# Patient Record
Sex: Female | Born: 1950 | Race: Black or African American | Hispanic: No | Marital: Single | Smoking: Former smoker
Health system: Southern US, Community
[De-identification: ages and names within clinical notes are randomized; demographics above are authoritative.]

## PROBLEM LIST (undated history)

## (undated) DIAGNOSIS — E785 Hyperlipidemia, unspecified: Secondary | ICD-10-CM

## (undated) DIAGNOSIS — E119 Type 2 diabetes mellitus without complications: Secondary | ICD-10-CM

## (undated) DIAGNOSIS — R569 Unspecified convulsions: Secondary | ICD-10-CM

## (undated) DIAGNOSIS — R0602 Shortness of breath: Secondary | ICD-10-CM

## (undated) DIAGNOSIS — I219 Acute myocardial infarction, unspecified: Secondary | ICD-10-CM

## (undated) DIAGNOSIS — I509 Heart failure, unspecified: Secondary | ICD-10-CM

## (undated) DIAGNOSIS — I10 Essential (primary) hypertension: Secondary | ICD-10-CM

## (undated) DIAGNOSIS — I639 Cerebral infarction, unspecified: Secondary | ICD-10-CM

## (undated) DIAGNOSIS — R062 Wheezing: Secondary | ICD-10-CM

## (undated) DIAGNOSIS — J45909 Unspecified asthma, uncomplicated: Secondary | ICD-10-CM

## (undated) HISTORY — DX: Shortness of breath: R06.02

## (undated) HISTORY — PX: PACEMAKER IMPLANT: EP1218

## (undated) HISTORY — DX: Wheezing: R06.2

---

## 2017-03-29 DIAGNOSIS — I6930 Unspecified sequelae of cerebral infarction: Secondary | ICD-10-CM | POA: Insufficient documentation

## 2017-03-29 DIAGNOSIS — J449 Chronic obstructive pulmonary disease, unspecified: Secondary | ICD-10-CM | POA: Insufficient documentation

## 2017-03-29 DIAGNOSIS — M4802 Spinal stenosis, cervical region: Secondary | ICD-10-CM | POA: Insufficient documentation

## 2017-03-29 DIAGNOSIS — G894 Chronic pain syndrome: Secondary | ICD-10-CM | POA: Insufficient documentation

## 2017-03-29 DIAGNOSIS — M47816 Spondylosis without myelopathy or radiculopathy, lumbar region: Secondary | ICD-10-CM | POA: Insufficient documentation

## 2017-03-29 DIAGNOSIS — K219 Gastro-esophageal reflux disease without esophagitis: Secondary | ICD-10-CM | POA: Insufficient documentation

## 2017-03-29 DIAGNOSIS — E785 Hyperlipidemia, unspecified: Secondary | ICD-10-CM | POA: Insufficient documentation

## 2017-03-29 DIAGNOSIS — Z720 Tobacco use: Secondary | ICD-10-CM | POA: Insufficient documentation

## 2017-03-29 DIAGNOSIS — Z950 Presence of cardiac pacemaker: Secondary | ICD-10-CM | POA: Insufficient documentation

## 2018-05-24 DIAGNOSIS — N3946 Mixed incontinence: Secondary | ICD-10-CM | POA: Insufficient documentation

## 2018-07-07 DIAGNOSIS — Z79899 Other long term (current) drug therapy: Secondary | ICD-10-CM | POA: Insufficient documentation

## 2019-04-18 DIAGNOSIS — K5904 Chronic idiopathic constipation: Secondary | ICD-10-CM | POA: Insufficient documentation

## 2019-04-18 DIAGNOSIS — Z1212 Encounter for screening for malignant neoplasm of rectum: Secondary | ICD-10-CM | POA: Insufficient documentation

## 2019-04-18 DIAGNOSIS — K5900 Constipation, unspecified: Secondary | ICD-10-CM | POA: Insufficient documentation

## 2019-04-18 DIAGNOSIS — Z86010 Personal history of colonic polyps: Secondary | ICD-10-CM | POA: Insufficient documentation

## 2019-04-18 DIAGNOSIS — Z1211 Encounter for screening for malignant neoplasm of colon: Secondary | ICD-10-CM | POA: Insufficient documentation

## 2019-09-16 ENCOUNTER — Other Ambulatory Visit: Payer: Self-pay

## 2019-09-16 ENCOUNTER — Encounter (HOSPITAL_COMMUNITY): Payer: Self-pay | Admitting: Emergency Medicine

## 2019-09-16 ENCOUNTER — Ambulatory Visit (HOSPITAL_COMMUNITY)
Admission: EM | Admit: 2019-09-16 | Discharge: 2019-09-16 | Disposition: A | Discharge status: Home / Self Care | Payer: Medicare Other | Attending: Family Medicine | Admitting: Family Medicine

## 2019-09-16 DIAGNOSIS — E785 Hyperlipidemia, unspecified: Secondary | ICD-10-CM | POA: Diagnosis not present

## 2019-09-16 DIAGNOSIS — J441 Chronic obstructive pulmonary disease with (acute) exacerbation: Secondary | ICD-10-CM | POA: Insufficient documentation

## 2019-09-16 DIAGNOSIS — Z950 Presence of cardiac pacemaker: Secondary | ICD-10-CM | POA: Diagnosis not present

## 2019-09-16 DIAGNOSIS — Z79899 Other long term (current) drug therapy: Secondary | ICD-10-CM | POA: Insufficient documentation

## 2019-09-16 DIAGNOSIS — M545 Low back pain, unspecified: Secondary | ICD-10-CM

## 2019-09-16 DIAGNOSIS — K219 Gastro-esophageal reflux disease without esophagitis: Secondary | ICD-10-CM | POA: Diagnosis not present

## 2019-09-16 DIAGNOSIS — I10 Essential (primary) hypertension: Secondary | ICD-10-CM | POA: Insufficient documentation

## 2019-09-16 DIAGNOSIS — H9202 Otalgia, left ear: Secondary | ICD-10-CM | POA: Diagnosis not present

## 2019-09-16 DIAGNOSIS — Z20828 Contact with and (suspected) exposure to other viral communicable diseases: Secondary | ICD-10-CM | POA: Insufficient documentation

## 2019-09-16 DIAGNOSIS — G8929 Other chronic pain: Secondary | ICD-10-CM | POA: Diagnosis not present

## 2019-09-16 MED ORDER — TIZANIDINE HCL 4 MG PO TABS: 4.0000 mg | ORAL_TABLET | Freq: Four times a day (QID) | ORAL | 0 refills | Status: DC | PRN

## 2019-09-16 MED ORDER — KETOROLAC TROMETHAMINE 30 MG/ML IJ SOLN
INTRAMUSCULAR | Status: AC
  Filled 2019-09-16: qty 1

## 2019-09-16 MED ORDER — PREDNISONE 20 MG PO TABS: 20.0000 mg | ORAL_TABLET | Freq: Two times a day (BID) | ORAL | 0 refills | Status: DC

## 2019-09-16 MED ORDER — HYDROCODONE-ACETAMINOPHEN 7.5-325 MG PO TABS: 1.0000 | ORAL_TABLET | Freq: Four times a day (QID) | ORAL | 0 refills | Status: DC | PRN

## 2019-09-16 MED ORDER — KETOROLAC TROMETHAMINE 30 MG/ML IJ SOLN
30.0000 mg | Freq: Once | INTRAMUSCULAR | Status: AC
  Administered 2019-09-16: 30 mg via INTRAMUSCULAR

## 2019-09-16 MED ORDER — AMOXICILLIN-POT CLAVULANATE 875-125 MG PO TABS: 1.0000 | ORAL_TABLET | Freq: Two times a day (BID) | ORAL | 0 refills | Status: DC

## 2019-09-16 MED ORDER — BENZONATATE 200 MG PO CAPS: 200.0000 mg | ORAL_CAPSULE | Freq: Two times a day (BID) | ORAL | 0 refills | Status: DC | PRN

## 2019-09-16 NOTE — ED Provider Notes (Signed)
[facility]    CSN: [id number] Arrival date & time: 09/16/19  1136      History   Chief Complaint Chief Complaint  Patient presents with  . Otalgia  . Back Pain  . Cough    HPI Kylie Sanders is a 68 y.o. female.   HPI Patient is here with multiple complaints.  She apparently moved to the area recently from the [place] area.  She is living with her daughter.  She states that her son dropped her off.  History is very challenging because when asked frequently answers "I do not know".  She does say she has COPD.  She has had COPD exacerbations.  She currently has a cough.  This is been present for 2 weeks.  She has been using her inhaler more frequently.  He does not know the name of her inhaler, but when I say albuterol she does agree.  She denies fever or body aches.  Denies exposure to coronavirus.  She is wearing an inadequate single-layer mask.  Denies short of breath.  No nausea vomiting. She also complains of left ear pain.  Decreased hearing.  She states that she thinks that her ear is "blocked".  Cannot tell me if this is been a problem for her in the past. She has a history of chronic low back pain.  She states that today it is the worst that it is been in a long time.  She takes Tylenol and ibuprofen "all the time".  No pain into the legs.  No numbness or weakness.  Uncertain diagnosis, disc disease, arthritis, or old injury. I am able to access some of her old records in the care everywhere portion of the chart.  They do document chronic low back pain, trial of physical therapy.  Mostly takes over-the-counter medicines.  I did check the [facility] for narcotics and she has not had any in 2020. History reviewed. No pertinent past medical history.  There are no active problems to display for this patient. 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 No pertinent surgical history.  OB History   No obstetric history on file.      Home Medications    Prior to Admission medications   Medication Sig Start Date End Date Taking? Authorizing Provider  amoxicillin-clavulanate (AUGMENTIN) 875-125 MG tablet Take 1 tablet by mouth every 12 (twelve) hours. 09/16/19   Eustace Moore, MD  benzonatate (TESSALON) 200 MG capsule Take 1 capsule (200 mg total) by mouth 2 (two) times daily as needed for cough. 09/16/19   Eustace Moore, MD  HYDROcodone-acetaminophen (NORCO) 7.5-325 MG tablet Take 1 tablet by mouth every 6 (six) hours as needed for moderate pain. 09/16/19   Eustace Moore, MD  predniSONE (DELTASONE) 20 MG tablet Take 1 tablet (20 mg total) by mouth 2 (two) times daily with a meal. 09/16/19   Raylene Everts, MD  tiZANidine (ZANAFLEX) 4 MG tablet Take 1-2 tablets (4-8 mg total) by mouth every 6 (six) hours as needed for muscle spasms. 09/16/19   Raylene Everts, MD  CARE EVERYWHERE CHART: mouth 2 (two) times a day with meals. 60 tablet  3 11/20/2017  Active   CETIRIZINE 10 mg tablet  Indications: Acute seasonal allergic rhinitis due to pollen TAKE ONE  TABLET BY MOUTH EVERY DAY 90 tablet  1 02/13/2018  Active  albuterol 2.5 mg /3 mL (0.083 %) nebulizer solution  USE 1 VIAL in NEBULIZER EVERY 6 HOURS AS NEEDED FOR WHEEZING 75 mL  3 08/16/2018  Active  umeclidinium-vilanterol (ANORO ELLIPTA) 62.5-25 mcg/actuation dsdv  Inhale 1 puff once daily. 30 each  11 10/04/2018  Active  oxyCODONE-acetaminophen (PERCOCET) 10-325 mg per tablet  Take 1 tablet by mouth every 8 (eight) hours as needed for moderate pain (4-6). 60 tablet  0 11/21/2018  Active  ergocalciferol (VITAMIN D2) 50,000 unit capsule  TAKE ONE CAPSULE BY MOUTH ONCE A WEEK 4 capsule  2 01/15/2019  Active  dextromethorphan-guaifenesin (Mucinex DM) 30-600 mg per 12 hr tablet  Indications: Lower resp. tract infection Take 1 tablet by mouth 2 (two) times a day. 30 tablet  0 01/21/2019  Active  potassium chloride (MICRO-K) 10 mEq CR capsule  TAKE ONE CAPSULE BY MOUTH TWICE DAILY 180 capsule  1 02/13/2019  Active  fluticasone propionate (FLONASE) 50 mcg/spray nasal spray  Indications: Acute seasonal allergic rhinitis due to pollen adminster ONE SPRAY IN EACH NOSTRIL EVERY DAY 16 g  5 02/13/2019  Active  albuterol HFA (ProAir HFA) 90 mcg/actuation inhaler  Inhale 2 puffs every 6 (six) hours as needed for wheezing. 8.5 g  3 03/08/2019  Active  tiZANidine (ZANAFLEX) 4 mg tablet  TAKE ONE TABLET BY MOUTH EVERY 6 HOURS AS NEEDED FOR MUSCLE SPASMS 60 tablet  1 06/27/2019  Active  pravastatin (PRAVACHOL) 40 mg tablet  TAKE 1 TABLET BY MOUTH ONCE DAILY 30 tablet  2 08/08/2019  Active  Amitiza 24 mcg capsule  TAKE 1 CAPSULE BY MOUTH TWICE DAILY WITH MEALS 60 capsule  1 08/08/2019  Active  omeprazole (PriLOSEC) 20 mg DR capsule  TAKE 1 CAPSULE BY MOUTH EVERY DAY 90 capsule  0 08/08/2019  Active  losartan-hydrochlorothiazide (HYZAAR) 50-12.5 mg per tablet  TAKE 1 TABLET BY MOUTH ONCE DAILY 90 tablet  1 08/12/2019  Active    Family History History reviewed. No pertinent  family history.  Social History Social History   Tobacco Use  . Smoking status: Never Smoker  . Smokeless tobacco: Never Used  Substance Use Topics  . Alcohol use: Not Currently  . Drug use: Never     Allergies   Patient has no known allergies.   Review of Systems Review of Systems  Constitutional: Negative for chills and fever.  HENT: Positive for ear pain and hearing loss. Negative for sore throat.   Eyes: Negative for pain and visual disturbance.  Respiratory: Positive for cough and shortness of breath.   Cardiovascular: Negative for chest pain and palpitations.  Gastrointestinal: Positive for constipation. Negative for abdominal pain and vomiting.       CHRONIC  Genitourinary: Negative for dysuria and hematuria.  Musculoskeletal: Positive for back pain. Negative for arthralgias.       CHRONIC  Skin: Negative for color change  and rash.  Neurological: Negative for seizures and syncope.  All other systems reviewed and are negative.    Physical Exam Triage Vital Signs ED Triage Vitals  Enc Vitals Group     BP 09/16/19 1228 121/85     Pulse Rate 09/16/19 1228 64     Resp 09/16/19 1228 17     Temp 09/16/19 1228 (!) 97.3 F (36.3 C)     Temp Source 09/16/19 1228 Tympanic     SpO2 09/16/19 1228 97 %     Weight --      Height --      Head Circumference --      Peak Flow --      Pain Score 09/16/19 1233 8     Pain Loc --      Pain Edu? --      Excl. in [place]? --    No data found.  Updated Vital Signs BP 121/85 (BP Location: Right Arm)   Pulse 64   Temp (!) 97.3 F (36.3 C) (Tympanic)   Resp 17   SpO2 97%       Physical Exam Constitutional:      General: She is not in acute distress.    Appearance: She is well-developed.     Comments: Comes in wheelchair.  States back pain prevents her from walking very far  HENT:     Head: Normocephalic and atraumatic.     Right Ear: Tympanic membrane, ear canal and external ear normal.     Left Ear: Tympanic  membrane, ear canal and external ear normal.     Ears:     Comments: TMs and canals are normal    Mouth/Throat:     Mouth: Mucous membranes are moist.  Eyes:     Conjunctiva/sclera: Conjunctivae normal.     Pupils: Pupils are equal, round, and reactive to light.  Neck:     Musculoskeletal: Normal range of motion.  Cardiovascular:     Rate and Rhythm: Normal rate and regular rhythm.     Heart sounds: Normal heart sounds.     Comments: Pacemaker scar left upper chest Pulmonary:     Effort: Pulmonary effort is normal. No respiratory distress.     Breath sounds: Rhonchi present.     Comments: Few rhonchi.  Harsh cough.  No wheezing Abdominal:     General: There is no distension.     Palpations: Abdomen is soft.  Musculoskeletal: Normal range of motion.  Skin:    General: Skin is warm and dry.  Neurological:     Mental Status: She is alert.  Psychiatric:     Comments: Poor fund of knowledge      UC Treatments / Results  Labs (all labs ordered are listed, but only abnormal results are displayed) Labs Reviewed  NOVEL CORONAVIRUS, NAA (HOSP ORDER, SEND-OUT TO REF LAB; TAT 18-24 HRS)    EKG   Radiology No results found.  Procedures Procedures (including critical care time)  Medications Ordered in UC Medications  ketorolac (TORADOL) 30 MG/ML injection 30 mg (30 mg Intramuscular Given 09/16/19 1321)  ketorolac (TORADOL) 30 MG/ML injection (has no administration in time range)    Initial Impression / Assessment and Plan / UC Course  I have reviewed the triage vital signs and the nursing notes.  Pertinent labs & imaging results that were available during my care of the patient were reviewed by me and considered in my medical decision making (see chart for details).  Time was spent in reviewing her medical records.  Getting history from patient.  Explained the patient's diagnosis and medications.Greater than 50% of this visit was spent in counseling and coordinating  care.  Total face to face time:   40 MIN Final Clinical Impressions(s) / UC Diagnoses   Final diagnoses:  COPD with exacerbation (HCC)  Chronic bilateral low back pain without sciatica     Discharge Instructions     Take the antibiotic 2 x a day for infection Take the prednisone 2 x a day for inflammation Take the tessalon 2 x a day for cough Drink plenty of fluids  Take the norco - hydrocodone as needed severe pain.  Do not drive on the hydrocodone Take the tizanidine as needed muscle relaxer Ice or heat to back  Follow up with a PCP   ED Prescriptions    Medication Sig Dispense Auth. Provider   benzonatate (TESSALON) 200 MG capsule Take 1 capsule (200 mg total) by mouth 2 (two) times daily as needed for cough. 20 capsule Eustace Moore, MD   amoxicillin-clavulanate (AUGMENTIN) 875-125 MG tablet Take 1 tablet by mouth every 12 (twelve) hours. 14 tablet Eustace Moore, MD   HYDROcodone-acetaminophen [facility]) 7.5-325 MG tablet Take 1 tablet by mouth every 6 (six) hours as needed for moderate pain. 15 tablet Eustace Moore, MD   tiZANidine (ZANAFLEX) 4 MG tablet Take 1-2 tablets (4-8 mg total) by mouth every 6 (six) hours as needed for muscle spasms. 21 tablet Eustace Moore, MD   predniSONE (DELTASONE) 20 MG tablet Take 1 tablet (20 mg total) by mouth 2 (two) times daily with a meal. 10 tablet Eustace Moore, MD     I have reviewed the PDMP during this encounter.   Eustace Moore, MD 09/16/19 [phone]

## 2019-09-16 NOTE — Discharge Instructions (Signed)
Take the antibiotic 2 x a day for infection Take the prednisone 2 x a day for inflammation Take the tessalon 2 x a day for cough Drink plenty of fluids  Take the norco - hydrocodone as needed severe pain.  Do not drive on the hydrocodone Take the tizanidine as needed muscle relaxer Ice or heat to back  Follow up with a PCP

## 2019-09-16 NOTE — ED Triage Notes (Signed)
Pt sts left sided ear and facial pain; pt sts lower back pain and some cough; denies fever

## 2019-09-18 LAB — NOVEL CORONAVIRUS, NAA (HOSP ORDER, SEND-OUT TO REF LAB; TAT 18-24 HRS): SARS-CoV-2, NAA: NOT DETECTED

## 2019-12-12 ENCOUNTER — Other Ambulatory Visit: Payer: Self-pay

## 2019-12-12 ENCOUNTER — Ambulatory Visit (INDEPENDENT_AMBULATORY_CARE_PROVIDER_SITE_OTHER): Payer: Medicare Other

## 2019-12-12 ENCOUNTER — Ambulatory Visit (HOSPITAL_COMMUNITY)
Admission: EM | Admit: 2019-12-12 | Discharge: 2019-12-12 | Disposition: A | Discharge status: Home / Self Care | Payer: Medicare Other | Attending: Internal Medicine | Admitting: Internal Medicine

## 2019-12-12 ENCOUNTER — Encounter (HOSPITAL_COMMUNITY): Payer: Self-pay

## 2019-12-12 DIAGNOSIS — U071 COVID-19: Secondary | ICD-10-CM | POA: Insufficient documentation

## 2019-12-12 DIAGNOSIS — Z8616 Personal history of COVID-19: Secondary | ICD-10-CM

## 2019-12-12 DIAGNOSIS — R0602 Shortness of breath: Secondary | ICD-10-CM | POA: Diagnosis not present

## 2019-12-12 DIAGNOSIS — R05 Cough: Secondary | ICD-10-CM | POA: Diagnosis not present

## 2019-12-12 DIAGNOSIS — R0789 Other chest pain: Secondary | ICD-10-CM

## 2019-12-12 HISTORY — DX: Personal history of COVID-19: Z86.16

## 2019-12-12 LAB — BASIC METABOLIC PANEL
Anion gap: 8 (ref 5–15)
BUN: 28 mg/dL — ABNORMAL HIGH (ref 8–23)
CO2: 24 mmol/L (ref 22–32)
Calcium: 11.8 mg/dL — ABNORMAL HIGH (ref 8.9–10.3)
Chloride: 104 mmol/L (ref 98–111)
Creatinine, Ser: 1.54 mg/dL — ABNORMAL HIGH (ref 0.44–1.00)
GFR calc Af Amer: 40 mL/min — ABNORMAL LOW (ref >60)
GFR calc non Af Amer: 35 mL/min — ABNORMAL LOW (ref >60)
Glucose, Bld: 95 mg/dL (ref 70–99)
Potassium: 3.8 mmol/L (ref 3.5–5.1)
Sodium: 136 mmol/L (ref 135–145)

## 2019-12-12 LAB — CBC
HCT: 42.1 % (ref 36.0–46.0)
Hemoglobin: 13.8 g/dL (ref 12.0–15.0)
MCH: 28.1 pg (ref 26.0–34.0)
MCHC: 32.8 g/dL (ref 30.0–36.0)
MCV: 85.7 fL (ref 80.0–100.0)
Platelets: 177 10*3/uL (ref 150–400)
RBC: 4.91 MIL/uL (ref 3.87–5.11)
RDW: 13.2 % (ref 11.5–15.5)
WBC: 3.2 10*3/uL — ABNORMAL LOW (ref 4.0–10.5)
nRBC: 0 % (ref 0.0–0.2)

## 2019-12-12 LAB — POC SARS CORONAVIRUS 2 AG: SARS Coronavirus 2 Ag: POSITIVE — AB

## 2019-12-12 LAB — POC SARS CORONAVIRUS 2 AG -  ED: SARS Coronavirus 2 Ag: POSITIVE — AB

## 2019-12-12 MED ORDER — ONDANSETRON 4 MG PO TBDP: 4.0000 mg | ORAL_TABLET | Freq: Three times a day (TID) | ORAL | 0 refills | Status: DC | PRN

## 2019-12-12 MED ORDER — BENZONATATE 100 MG PO CAPS: 100.0000 mg | ORAL_CAPSULE | Freq: Three times a day (TID) | ORAL | 0 refills | Status: DC

## 2019-12-12 MED ORDER — IBUPROFEN 600 MG PO TABS: 600.0000 mg | ORAL_TABLET | Freq: Four times a day (QID) | ORAL | 0 refills | Status: DC | PRN

## 2019-12-12 NOTE — ED Provider Notes (Signed)
[facility]    CSN: [id number] Arrival date & time: 12/12/19  1113      History   Chief Complaint Chief Complaint  Patient presents with  . Dizziness  . Headache  . Back Pain    HPI Kylie Sanders is a 68 y.o. female with a history of COPD comes to the urgent care with a 1 week history of headache, dizziness and back pain. Patient says symptoms started a week ago and has been persistent. Kylie Sanders has a cough which is not productive of sputum. Kylie Sanders has had chills. No febrile episodes. No loss of taste or smell. Patient's daughter was recently diagnosed with COVID-19 infection. Patient has had decreased oral intake, has some nausea but denies diarrhea. Patient has some lightheadedness and unsteadiness on her feet. No numbness or tingling.   History reviewed. No pertinent past medical history.  There are no problems to display for this patient.   History reviewed. No pertinent surgical history.  OB History   No obstetric history on file.      Home Medications    Prior to Admission medications   Medication Sig Start Date End Date Taking? Authorizing Provider  benzonatate (TESSALON) 100 MG capsule Take 1 capsule (100 mg total) by mouth every 8 (eight) hours. 12/12/19   LampteyBritta Mccreedy, Lu Paradise O, MD  HYDROcodone-acetaminophen (NORCO) 7.5-325 MG tablet Take 1 tablet by mouth every 6 (six) hours as needed for moderate pain. 09/16/19   Eustace MooreNelson, Yvonne Sue, MD  ibuprofen (ADVIL) 600 MG tablet Take 1 tablet (600 mg total) by mouth every 6 (six) hours as needed. 12/12/19   Merrilee JanskyLamptey, Sondos Wolfman O, MD  ondansetron (ZOFRAN ODT) 4 MG disintegrating tablet Take 1 tablet (4 mg total) by mouth every 8 (eight) hours as needed for nausea or vomiting. 12/12/19   Nobie Alleyne, Britta MccreedyPhilip O, MD  tiZANidine (ZANAFLEX) 4 MG tablet Take 1-2 tablets (4-8 mg total) by mouth every 6 (six) hours as needed for muscle spasms. 09/16/19   Eustace MooreNelson, Yvonne Sue, MD    Family History History reviewed. No pertinent family  history.  Social History Social History   Tobacco Use  . Smoking status: Never Smoker  . Smokeless tobacco: Never Used  Substance Use Topics  . Alcohol use: Not Currently  . Drug use: Never     Allergies   Patient has no known allergies.   Review of Systems Review of Systems  Constitutional: Positive for activity change, chills and fatigue. Negative for fever.  HENT: Positive for congestion. Negative for postnasal drip and rhinorrhea.   Eyes: Positive for pain. Negative for photophobia and redness.  Respiratory: Positive for cough, chest tightness and shortness of breath.   Gastrointestinal: Positive for nausea and vomiting. Negative for diarrhea.  Endocrine: Negative.   Genitourinary: Negative for dysuria, frequency and urgency.  Musculoskeletal: Positive for arthralgias and myalgias. Negative for joint swelling.  Skin: Negative for rash and wound.  Neurological: Positive for dizziness, weakness, light-headedness and headaches.  Psychiatric/Behavioral: Negative for confusion and decreased concentration.     Physical Exam Triage Vital Signs ED Triage Vitals  Enc Vitals Group     BP      Pulse      Resp      Temp      Temp src      SpO2      Weight      Height      Head Circumference      Peak Flow      Pain Score  Pain Loc      Pain Edu?      Excl. in [place]?    No data found.  Updated Vital Signs BP 108/72 (BP Location: Right Arm)   Pulse 67   Temp 99.1 F (37.3 C) (Oral)   Resp 20   SpO2 98%   Visual Acuity Right Eye Distance:   Left Eye Distance:   Bilateral Distance:    Right Eye Near:   Left Eye Near:    Bilateral Near:     Physical Exam Constitutional:      General: Kylie Sanders is not in acute distress.    Appearance: Kylie Sanders is ill-appearing.  HENT:     Mouth/Throat:     Mouth: Mucous membranes are moist.  Eyes:     Extraocular Movements:     Right eye: Normal extraocular motion and no nystagmus.     Left eye: Normal extraocular motion and  no nystagmus.  Cardiovascular:     Rate and Rhythm: Normal rate and regular rhythm.     Heart sounds: Normal heart sounds. No murmur. No friction rub.  Pulmonary:     Effort: Pulmonary effort is normal. No respiratory distress.     Breath sounds: Normal breath sounds. No stridor. No wheezing or rhonchi.  Abdominal:     General: Bowel sounds are normal. There is no distension.     Palpations: Abdomen is soft. There is no mass.     Tenderness: There is no abdominal tenderness.  Musculoskeletal:        General: No swelling or tenderness. Normal range of motion.     Cervical back: Normal range of motion and neck supple. No rigidity.  Lymphadenopathy:     Cervical: No cervical adenopathy.  Skin:    General: Skin is warm.     Capillary Refill: Capillary refill takes less than 2 seconds.     Findings: No erythema or rash.  Neurological:     Mental Status: Kylie Sanders is alert.     GCS: GCS eye subscore is 4. GCS verbal subscore is 5. GCS motor subscore is 6.     Sensory: No sensory deficit.     Motor: No weakness.  Psychiatric:        Mood and Affect: Mood is depressed.     Comments: Patient has flat affect      UC Treatments / Results  Labs (all labs ordered are listed, but only abnormal results are displayed) Labs Reviewed  CBC - Abnormal; Notable for the following components:      Result Value   WBC 3.2 (*)    All other components within normal limits  BASIC METABOLIC PANEL - Abnormal; Notable for the following components:   BUN 28 (*)    Creatinine, Ser 1.54 (*)    Calcium 11.8 (*)    GFR calc non Af Amer 35 (*)    GFR calc Af Amer 40 (*)    All other components within normal limits  POC SARS CORONAVIRUS 2 AG -  ED - Abnormal; Notable for the following components:   SARS Coronavirus 2 Ag POSITIVE (*)    All other components within normal limits  POC SARS CORONAVIRUS 2 AG - Abnormal; Notable for the following components:   SARS Coronavirus 2 Ag POSITIVE (*)    All other  components within normal limits    EKG   Radiology DG Chest 2 View  Result Date: 12/12/2019 CLINICAL DATA:  Pt states Kylie Sanders has been experiencing neck pain, leg  pain, and back pain with difficulty breathing x Monday. Pt is not a smoker. Pt has hx of hypertension and diabetes. Denies previous chest/lung injury. Pt does have a pacemaker. SRP EXAM: CHEST - 2 VIEW COMPARISON:  None. FINDINGS: Cardiac silhouette is normal in size and configuration. Left anterior chest wall sequential pacemaker is well positioned, leads projecting in the right atrium and right ventricle. No mediastinal or hilar masses or evidence of adenopathy. The pulmonary arteries are prominent. Lungs are mildly hyperexpanded, but clear. No pleural effusion or pneumothorax. Skeletal structures are intact. IMPRESSION: No active cardiopulmonary disease. Electronically Signed   By: Amie Portland M.D.   On: 12/12/2019 13:16    Procedures Procedures (including critical care time)  Medications Ordered in UC Medications - No data to display  Initial Impression / Assessment and Plan / UC Course  I have reviewed the triage vital signs and the nursing notes.  Pertinent labs & imaging results that were available during my care of the patient were reviewed by me and considered in my medical decision making (see chart for details).     1. COVID-19 infection: Chest x-ray is negative for acute lung infiltrate Point-of-care COVID-19 test is positive Tessalon Perles as needed for cough Zofran ODT as needed for nausea/vomiting Ibuprofen 600 mg every 6 hours as needed for body aches Patient is advised to self isolate If patient symptoms worsen Kylie Sanders is advised to go to the emergency department to be evaluated. Final Clinical Impressions(s) / UC Diagnoses   Final diagnoses:  COVID-19 virus infection     Discharge Instructions     Increase oral fluid intake Patient is advised to self isolate Please take medications as prescribed by  me Shortness of breath worsening low to emergency department to be reevaluated You will receive a call from the outpatient infusion center to keep your phone close by where you can hear it should it ring.    ED Prescriptions    Medication Sig Dispense Auth. Provider   benzonatate (TESSALON) 100 MG capsule Take 1 capsule (100 mg total) by mouth every 8 (eight) hours. 30 capsule Kalel Harty, Britta Mccreedy, MD   ondansetron (ZOFRAN ODT) 4 MG disintegrating tablet Take 1 tablet (4 mg total) by mouth every 8 (eight) hours as needed for nausea or vomiting. 20 tablet Jerimie Mancuso, Britta Mccreedy, MD   ibuprofen (ADVIL) 600 MG tablet Take 1 tablet (600 mg total) by mouth every 6 (six) hours as needed. 30 tablet Jessicia Napolitano, Britta Mccreedy, MD     PDMP not reviewed this encounter.   Merrilee Jansky, MD 12/12/19 [phone]

## 2019-12-12 NOTE — ED Triage Notes (Signed)
Pt presents with headache, dizziness, and back pain since Friday of last week.

## 2019-12-12 NOTE — Discharge Instructions (Addendum)
Increase oral fluid intake Patient is advised to self isolate Please take medications as prescribed by me Shortness of breath worsening low to emergency department to be reevaluated You will receive a call from the outpatient infusion center to keep your phone close by where you can hear it should it ring.

## 2019-12-13 ENCOUNTER — Telehealth: Payer: Self-pay | Admitting: Unknown Physician Specialty

## 2019-12-13 DIAGNOSIS — E876 Hypokalemia: Secondary | ICD-10-CM

## 2019-12-13 HISTORY — DX: Hypokalemia: E87.6

## 2019-12-13 NOTE — Telephone Encounter (Signed)
Called to discuss with patient about Covid symptoms and the use of bamlanivimab, a monoclonal antibody infusion for those with mild to moderate Covid symptoms and at a high risk of hospitalization.   Message left to call back  

## 2019-12-13 NOTE — Telephone Encounter (Signed)
  I connected by phone with Kylie Sanders on 12/13/2019 at 11:33 AM to discuss the potential use of an new treatment for mild to moderate COVID-19 viral infection in non-hospitalized patients.  This patient is a 69 y.o. female that meets the FDA criteria for Emergency Use Authorization of bamlanivimab or casirivimab\imdevimab.  Has a (+) direct SARS-CoV-2 viral test result  Has mild or moderate COVID-19   Is ? 67 years of age and weighs ? 40 kg  Is NOT hospitalized due to COVID-19  Is NOT requiring oxygen therapy or requiring an increase in baseline oxygen flow rate due to COVID-19  Is within 10 days of symptom onset  Has at least one of the high risk factor(s) for progression to severe COVID-19 and/or hospitalization as defined in EUA.  Specific high risk criteria : >/= 67 yo   I have spoken and communicated the following to the patient or parent/caregiver:  1. FDA has authorized the emergency use of bamlanivimab and casirivimab\imdevimab for the treatment of mild to moderate COVID-19 in adults and pediatric patients with positive results of direct SARS-CoV-2 viral testing who are 51 years of age and older weighing at least 40 kg, and who are at high risk for progressing to severe COVID-19 and/or hospitalization.  2. The significant known and potential risks and benefits of bamlanivimab and casirivimab\imdevimab, and the extent to which such potential risks and benefits are unknown.  3. Information on available alternative treatments and the risks and benefits of those alternatives, including clinical trials.  4. Patients treated with bamlanivimab and casirivimab\imdevimab should continue to self-isolate and use infection control measures (e.g., wear mask, isolate, social distance, avoid sharing personal items, clean and disinfect "high touch" surfaces, and frequent handwashing) according to CDC guidelines.   5. The patient or parent/caregiver has the option to accept or refuse  bamlanivimab or casirivimab\imdevimab .  After reviewing this information with the patient, The patient has DECLINED offer to receive the infusion. Kylie Sanders 12/13/2019 11:33 AM  Symptom onset 12/26 - Pt would like a guarantee that mab treatment will help her pain.  I could not give that so declined treatment

## 2019-12-17 ENCOUNTER — Encounter (HOSPITAL_COMMUNITY): Payer: Self-pay | Admitting: Student

## 2019-12-17 ENCOUNTER — Inpatient Hospital Stay (HOSPITAL_COMMUNITY)
Admission: EM | Admit: 2019-12-17 | Discharge: 2019-12-22 | DRG: 177 | Disposition: A | Discharge status: Home Health | Payer: Medicare Other | Attending: Internal Medicine | Admitting: Internal Medicine

## 2019-12-17 ENCOUNTER — Other Ambulatory Visit: Payer: Self-pay

## 2019-12-17 ENCOUNTER — Emergency Department (HOSPITAL_COMMUNITY): Payer: Medicare Other

## 2019-12-17 DIAGNOSIS — Z87891 Personal history of nicotine dependence: Secondary | ICD-10-CM

## 2019-12-17 DIAGNOSIS — N179 Acute kidney failure, unspecified: Secondary | ICD-10-CM | POA: Diagnosis present

## 2019-12-17 DIAGNOSIS — Z79899 Other long term (current) drug therapy: Secondary | ICD-10-CM

## 2019-12-17 DIAGNOSIS — G4733 Obstructive sleep apnea (adult) (pediatric): Secondary | ICD-10-CM | POA: Diagnosis not present

## 2019-12-17 DIAGNOSIS — I252 Old myocardial infarction: Secondary | ICD-10-CM

## 2019-12-17 DIAGNOSIS — Z8673 Personal history of transient ischemic attack (TIA), and cerebral infarction without residual deficits: Secondary | ICD-10-CM | POA: Diagnosis not present

## 2019-12-17 DIAGNOSIS — J440 Chronic obstructive pulmonary disease with acute lower respiratory infection: Secondary | ICD-10-CM | POA: Diagnosis not present

## 2019-12-17 DIAGNOSIS — Z833 Family history of diabetes mellitus: Secondary | ICD-10-CM

## 2019-12-17 DIAGNOSIS — G8929 Other chronic pain: Secondary | ICD-10-CM | POA: Diagnosis present

## 2019-12-17 DIAGNOSIS — Z993 Dependence on wheelchair: Secondary | ICD-10-CM

## 2019-12-17 DIAGNOSIS — I130 Hypertensive heart and chronic kidney disease with heart failure and stage 1 through stage 4 chronic kidney disease, or unspecified chronic kidney disease: Secondary | ICD-10-CM | POA: Diagnosis not present

## 2019-12-17 DIAGNOSIS — A0839 Other viral enteritis: Secondary | ICD-10-CM | POA: Diagnosis present

## 2019-12-17 DIAGNOSIS — Z8249 Family history of ischemic heart disease and other diseases of the circulatory system: Secondary | ICD-10-CM

## 2019-12-17 DIAGNOSIS — I5032 Chronic diastolic (congestive) heart failure: Secondary | ICD-10-CM | POA: Diagnosis not present

## 2019-12-17 DIAGNOSIS — N182 Chronic kidney disease, stage 2 (mild): Secondary | ICD-10-CM | POA: Diagnosis present

## 2019-12-17 DIAGNOSIS — U071 COVID-19: Principal | ICD-10-CM

## 2019-12-17 DIAGNOSIS — E860 Dehydration: Secondary | ICD-10-CM | POA: Diagnosis not present

## 2019-12-17 DIAGNOSIS — E861 Hypovolemia: Secondary | ICD-10-CM | POA: Diagnosis not present

## 2019-12-17 DIAGNOSIS — Z950 Presence of cardiac pacemaker: Secondary | ICD-10-CM | POA: Diagnosis not present

## 2019-12-17 DIAGNOSIS — R0600 Dyspnea, unspecified: Secondary | ICD-10-CM

## 2019-12-17 DIAGNOSIS — J1282 Pneumonia due to coronavirus disease 2019: Secondary | ICD-10-CM | POA: Diagnosis present

## 2019-12-17 DIAGNOSIS — E1122 Type 2 diabetes mellitus with diabetic chronic kidney disease: Secondary | ICD-10-CM | POA: Diagnosis not present

## 2019-12-17 DIAGNOSIS — R531 Weakness: Secondary | ICD-10-CM | POA: Diagnosis not present

## 2019-12-17 DIAGNOSIS — Z79891 Long term (current) use of opiate analgesic: Secondary | ICD-10-CM | POA: Diagnosis not present

## 2019-12-17 HISTORY — DX: Cerebral infarction, unspecified: I63.9

## 2019-12-17 HISTORY — DX: Hyperlipidemia, unspecified: E78.5

## 2019-12-17 HISTORY — DX: Acute myocardial infarction, unspecified: I21.9

## 2019-12-17 HISTORY — DX: Unspecified asthma, uncomplicated: J45.909

## 2019-12-17 HISTORY — DX: Essential (primary) hypertension: I10

## 2019-12-17 HISTORY — DX: Heart failure, unspecified: I50.9

## 2019-12-17 HISTORY — DX: Unspecified convulsions: R56.9

## 2019-12-17 HISTORY — DX: Type 2 diabetes mellitus without complications: E11.9

## 2019-12-17 LAB — CBC WITH DIFFERENTIAL/PLATELET
Abs Immature Granulocytes: 0.02 10*3/uL (ref 0.00–0.07)
Basophils Absolute: 0 10*3/uL (ref 0.0–0.1)
Basophils Relative: 0 %
Eosinophils Absolute: 0 10*3/uL (ref 0.0–0.5)
Eosinophils Relative: 0 %
HCT: 43.6 % (ref 36.0–46.0)
Hemoglobin: 14.7 g/dL (ref 12.0–15.0)
Immature Granulocytes: 0 %
Lymphocytes Relative: 21 %
Lymphs Abs: 1 10*3/uL (ref 0.7–4.0)
MCH: 28.5 pg (ref 26.0–34.0)
MCHC: 33.7 g/dL (ref 30.0–36.0)
MCV: 84.7 fL (ref 80.0–100.0)
Monocytes Absolute: 0.3 10*3/uL (ref 0.1–1.0)
Monocytes Relative: 6 %
Neutro Abs: 3.4 10*3/uL (ref 1.7–7.7)
Neutrophils Relative %: 73 %
Platelets: 179 10*3/uL (ref 150–400)
RBC: 5.15 MIL/uL — ABNORMAL HIGH (ref 3.87–5.11)
RDW: 13.1 % (ref 11.5–15.5)
WBC: 4.7 10*3/uL (ref 4.0–10.5)
nRBC: 0 % (ref 0.0–0.2)

## 2019-12-17 LAB — COMPREHENSIVE METABOLIC PANEL
ALT: 15 U/L (ref 0–44)
AST: 21 U/L (ref 15–41)
Albumin: 3.5 g/dL (ref 3.5–5.0)
Alkaline Phosphatase: 73 U/L (ref 38–126)
Anion gap: 12 (ref 5–15)
BUN: 48 mg/dL — ABNORMAL HIGH (ref 8–23)
CO2: 22 mmol/L (ref 22–32)
Calcium: 11.9 mg/dL — ABNORMAL HIGH (ref 8.9–10.3)
Chloride: 103 mmol/L (ref 98–111)
Creatinine, Ser: 1.9 mg/dL — ABNORMAL HIGH (ref 0.44–1.00)
GFR calc Af Amer: 31 mL/min — ABNORMAL LOW (ref >60)
GFR calc non Af Amer: 27 mL/min — ABNORMAL LOW (ref >60)
Glucose, Bld: 113 mg/dL — ABNORMAL HIGH (ref 70–99)
Potassium: 4 mmol/L (ref 3.5–5.1)
Sodium: 137 mmol/L (ref 135–145)
Total Bilirubin: 0.9 mg/dL (ref 0.3–1.2)
Total Protein: 8.2 g/dL — ABNORMAL HIGH (ref 6.5–8.1)

## 2019-12-17 LAB — CBC
HCT: 42.7 % (ref 36.0–46.0)
Hemoglobin: 13.8 g/dL (ref 12.0–15.0)
MCH: 27.7 pg (ref 26.0–34.0)
MCHC: 32.3 g/dL (ref 30.0–36.0)
MCV: 85.6 fL (ref 80.0–100.0)
Platelets: 170 10*3/uL (ref 150–400)
RBC: 4.99 MIL/uL (ref 3.87–5.11)
RDW: 13.1 % (ref 11.5–15.5)
WBC: 4.2 10*3/uL (ref 4.0–10.5)
nRBC: 0 % (ref 0.0–0.2)

## 2019-12-17 LAB — URINALYSIS, ROUTINE W REFLEX MICROSCOPIC
Bilirubin Urine: NEGATIVE
Glucose, UA: NEGATIVE mg/dL
Hgb urine dipstick: NEGATIVE
Ketones, ur: 5 mg/dL — AB
Leukocytes,Ua: NEGATIVE
Nitrite: NEGATIVE
Protein, ur: 100 mg/dL — AB
Specific Gravity, Urine: 1.016 (ref 1.005–1.030)
pH: 5 (ref 5.0–8.0)

## 2019-12-17 LAB — CREATININE, SERUM
Creatinine, Ser: 1.64 mg/dL — ABNORMAL HIGH (ref 0.44–1.00)
GFR calc Af Amer: 37 mL/min — ABNORMAL LOW (ref >60)
GFR calc non Af Amer: 32 mL/min — ABNORMAL LOW (ref >60)

## 2019-12-17 LAB — ABO/RH: ABO/RH(D): A POS

## 2019-12-17 LAB — HIV ANTIBODY (ROUTINE TESTING W REFLEX): HIV Screen 4th Generation wRfx: NONREACTIVE

## 2019-12-17 LAB — C-REACTIVE PROTEIN: CRP: 9.6 mg/dL — ABNORMAL HIGH (ref <1.0)

## 2019-12-17 LAB — FERRITIN: Ferritin: 557 ng/mL — ABNORMAL HIGH (ref 11–307)

## 2019-12-17 LAB — D-DIMER, QUANTITATIVE: D-Dimer, Quant: 1.1 ug/mL-FEU — ABNORMAL HIGH (ref 0.00–0.50)

## 2019-12-17 MED ORDER — SODIUM CHLORIDE 0.9 % IV SOLN
200.0000 mg | Freq: Once | INTRAVENOUS | Status: AC
  Administered 2019-12-17: 200 mg via INTRAVENOUS
  Filled 2019-12-17: qty 40

## 2019-12-17 MED ORDER — ONDANSETRON HCL 4 MG/2ML IJ SOLN
4.0000 mg | Freq: Four times a day (QID) | INTRAMUSCULAR | Status: DC | PRN
  Administered 2019-12-18 – 2019-12-22 (×3): 4 mg via INTRAVENOUS
  Filled 2019-12-17 (×4): qty 2

## 2019-12-17 MED ORDER — SODIUM CHLORIDE 0.9 % IV BOLUS
500.0000 mL | Freq: Once | INTRAVENOUS | Status: AC
  Administered 2019-12-17: 13:00:00 500 mL via INTRAVENOUS

## 2019-12-17 MED ORDER — HYDROCODONE-ACETAMINOPHEN 7.5-325 MG PO TABS
1.0000 | ORAL_TABLET | Freq: Four times a day (QID) | ORAL | Status: DC | PRN
  Administered 2019-12-17 – 2019-12-22 (×8): 1 via ORAL
  Filled 2019-12-17 (×8): qty 1

## 2019-12-17 MED ORDER — TIZANIDINE HCL 4 MG PO TABS
4.0000 mg | ORAL_TABLET | Freq: Four times a day (QID) | ORAL | Status: DC | PRN
  Administered 2019-12-19: 11:00:00 4 mg via ORAL
  Filled 2019-12-17: qty 1

## 2019-12-17 MED ORDER — BENZONATATE 100 MG PO CAPS
100.0000 mg | ORAL_CAPSULE | Freq: Three times a day (TID) | ORAL | Status: DC
  Administered 2019-12-17 – 2019-12-22 (×14): 100 mg via ORAL
  Filled 2019-12-17 (×14): qty 1

## 2019-12-17 MED ORDER — ENOXAPARIN SODIUM 40 MG/0.4ML ~~LOC~~ SOLN
40.0000 mg | SUBCUTANEOUS | Status: DC
  Administered 2019-12-17 – 2019-12-21 (×5): 40 mg via SUBCUTANEOUS
  Filled 2019-12-17 (×5): qty 0.4

## 2019-12-17 MED ORDER — ONDANSETRON HCL 4 MG PO TABS: 4.0000 mg | ORAL_TABLET | Freq: Four times a day (QID) | ORAL | Status: DC | PRN

## 2019-12-17 MED ORDER — ENOXAPARIN SODIUM 30 MG/0.3ML ~~LOC~~ SOLN: 30.0000 mg | SUBCUTANEOUS | Status: DC

## 2019-12-17 MED ORDER — ZINC SULFATE 220 (50 ZN) MG PO CAPS
220.0000 mg | ORAL_CAPSULE | Freq: Every day | ORAL | Status: DC
  Administered 2019-12-17 – 2019-12-22 (×6): 220 mg via ORAL
  Filled 2019-12-17 (×6): qty 1

## 2019-12-17 MED ORDER — SODIUM CHLORIDE 0.9 % IV SOLN
100.0000 mg | Freq: Every day | INTRAVENOUS | Status: AC
  Administered 2019-12-18 – 2019-12-21 (×4): 100 mg via INTRAVENOUS
  Filled 2019-12-17 (×4): qty 20

## 2019-12-17 MED ORDER — LACTATED RINGERS IV SOLN: INTRAVENOUS | Status: DC

## 2019-12-17 MED ORDER — ASCORBIC ACID 500 MG PO TABS
500.0000 mg | ORAL_TABLET | Freq: Every day | ORAL | Status: DC
  Administered 2019-12-18 – 2019-12-22 (×5): 500 mg via ORAL
  Filled 2019-12-17 (×4): qty 1

## 2019-12-17 MED ORDER — ALBUTEROL SULFATE HFA 108 (90 BASE) MCG/ACT IN AERS
2.0000 | INHALATION_SPRAY | Freq: Four times a day (QID) | RESPIRATORY_TRACT | Status: DC
  Administered 2019-12-17 – 2019-12-18 (×4): 2 via RESPIRATORY_TRACT
  Filled 2019-12-17 (×2): qty 6.7

## 2019-12-17 MED ORDER — ALBUTEROL SULFATE HFA 108 (90 BASE) MCG/ACT IN AERS
4.0000 | INHALATION_SPRAY | Freq: Once | RESPIRATORY_TRACT | Status: AC
  Administered 2019-12-17: 13:00:00 4 via RESPIRATORY_TRACT
  Filled 2019-12-17: qty 6.7

## 2019-12-17 MED ORDER — AEROCHAMBER PLUS FLO-VU MEDIUM MISC
1.0000 | Freq: Once | Status: AC
  Administered 2019-12-17: 13:00:00 1
  Filled 2019-12-17: qty 1

## 2019-12-17 NOTE — ED Provider Notes (Signed)
[facility] EMERGENCY DEPARTMENT Provider Note   CSN: [id number] Arrival date & time: 12/17/19  1135     History Chief Complaint  Patient presents with  . Shortness of Breath  . Fatigue    Kylie Sanders is a 69 y.o. female with a hx of COPD, hypertension, OSA, & chronic pain disorder who presents to the ED via EMS with complaints of worsening generalized weakness & dyspnea for the past 1 week. Patient states she has felt poorly with nasal congestion, sore throat, cough productive of clear sputum, dyspnea, body aches, chills, a few episodes of emesis/diarrhea, & generalized weakness. She was diagnosed with COVID 19 five days prior. Attempting to get up or move at all worsens her sxs, she feels she is having trouble walking due to her increased weakness, she gets lightheaded at times. She relays she typically uses a walker or a wheelchair. Denies fever, chest pain, hemoptysis, syncope, hematemesis, melena, or abdominal pain. Denies unilateral leg pain/swelling, hemoptysis, recent surgery/trauma, recent long travel, hormone use, personal hx of cancer, or hx of DVT/PE.     HPI     No past medical history on file.  There are no problems to display for this patient.   No past surgical history on file.   OB History   No obstetric history on file.     No family history on file.  Social History   Tobacco Use  . Smoking status: Never Smoker  . Smokeless tobacco: Never Used  Substance Use Topics  . Alcohol use: Not Currently  . Drug use: Never    Home Medications Prior to Admission medications   Medication Sig Start Date End Date Taking? Authorizing Provider  benzonatate (TESSALON) 100 MG capsule Take 1 capsule (100 mg total) by mouth every 8 (eight) hours. 12/12/19   LampteyBritta Mccreedy, MD  HYDROcodone-acetaminophen (NORCO) 7.5-325 MG tablet Take 1 tablet by mouth every 6 (six) hours as needed for moderate pain. 09/16/19   Eustace Moore, MD  ibuprofen  (ADVIL) 600 MG tablet Take 1 tablet (600 mg total) by mouth every 6 (six) hours as needed. 12/12/19   Merrilee Jansky, MD  ondansetron (ZOFRAN ODT) 4 MG disintegrating tablet Take 1 tablet (4 mg total) by mouth every 8 (eight) hours as needed for nausea or vomiting. 12/12/19   Lamptey, Britta Mccreedy, MD  tiZANidine (ZANAFLEX) 4 MG tablet Take 1-2 tablets (4-8 mg total) by mouth every 6 (six) hours as needed for muscle spasms. 09/16/19   Eustace Moore, MD    Allergies    Patient has no known allergies.  Review of Systems   Review of Systems  Constitutional: Positive for chills. Negative for fever.  HENT: Positive for congestion and sore throat. Negative for ear pain.   Respiratory: Positive for cough and shortness of breath.   Cardiovascular: Negative for chest pain and leg swelling.  Gastrointestinal: Positive for diarrhea and vomiting. Negative for abdominal pain.  Genitourinary: Negative for dysuria.  Musculoskeletal: Positive for myalgias.  Neurological: Positive for light-headedness. Negative for syncope.  All other systems reviewed and are negative.   Physical Exam Updated Vital Signs BP 94/77 (BP Location: Left Arm)   Pulse 61   Temp 98.3 F (36.8 C) (Oral)   Resp 16   SpO2 96%   Physical Exam Vitals and nursing note reviewed.  Constitutional:      General: She is not in acute distress.    Appearance: She is well-developed. She is ill-appearing. She  is not toxic-appearing.  HENT:     Head: Normocephalic and atraumatic.  Eyes:     General:        Right eye: No discharge.        Left eye: No discharge.     Conjunctiva/sclera: Conjunctivae normal.     Pupils: Pupils are equal, round, and reactive to light.  Cardiovascular:     Rate and Rhythm: Normal rate and regular rhythm.  Pulmonary:     Effort: Pulmonary effort is normal. No respiratory distress.     Breath sounds: Normal breath sounds. No wheezing, rhonchi or rales.  Abdominal:     General: There is no  distension.     Palpations: Abdomen is soft.     Tenderness: There is no abdominal tenderness. There is no guarding or rebound.  Musculoskeletal:     Cervical back: Neck supple.     Right lower leg: No edema.     Left lower leg: No edema.  Skin:    General: Skin is warm and dry.     Findings: No rash.  Neurological:     Mental Status: She is alert.     Comments: Clear speech.   Psychiatric:        Behavior: Behavior normal.    ED Results / Procedures / Treatments   Labs (all labs ordered are listed, but only abnormal results are displayed) Labs Reviewed  COMPREHENSIVE METABOLIC PANEL - Abnormal; Notable for the following components:      Result Value   Glucose, Bld 113 (*)    BUN 48 (*)    Creatinine, Ser 1.90 (*)    Calcium 11.9 (*)    Total Protein 8.2 (*)    GFR calc non Af Amer 27 (*)    GFR calc Af Amer 31 (*)    All other components within normal limits  CBC WITH DIFFERENTIAL/PLATELET - Abnormal; Notable for the following components:   RBC 5.15 (*)    All other components within normal limits  URINALYSIS, ROUTINE W REFLEX MICROSCOPIC    EKG None  Radiology DG Chest Portable 1 View  Result Date: 12/17/2019 CLINICAL DATA:  Shortness of breath.  Cough. EXAM: PORTABLE CHEST 1 VIEW COMPARISON:  12/12/2019. FINDINGS: Cardiac pacer with lead tip over the right atrium right ventricle. Heart size stable. No pulmonary venous congestion. Bibasilar pulmonary infiltrates, right side greater than left. No pleural effusion or pneumothorax. IMPRESSION: 1. Bibasilar pulmonary infiltrates, right side greater than left. Findings most consistent with pneumonia. Bibasilar pulmonary edema could also present in this fashion. 2. Cardiac pacer in stable position. Heart size normal. No pulmonary venous congestion. Electronically Signed   By: Maisie Fus  Register   On: 12/17/2019 13:18    Procedures Procedures (including critical care time)  Medications Ordered in ED Medications  sodium  chloride 0.9 % bolus 500 mL (0 mLs Intravenous Stopped 12/17/19 1406)  albuterol (VENTOLIN HFA) 108 (90 Base) MCG/ACT inhaler 4 puff (4 puffs Inhalation Given 12/17/19 1319)  AeroChamber Plus Flo-Vu Medium MISC 1 each (1 each Other Given 12/17/19 1320)    ED Course  I have reviewed the triage vital signs and the nursing notes.  Pertinent labs & imaging results that were available during my care of the patient were reviewed by me and considered in my medical decision making (see chart for details).    MDM Rules/Calculators/A&P                      Patient  with known covid 19 positive testing who has felt poorly for 1 week presents to the ED with complaints of worsening generalized weakness & dyspnea. On arrival she is not toxic appearing, but does appear ill. Lungs without obvious adventitious sounds. Will trial albuterol given hx of COPD. Small fluid bolous. Check labs, EKG, cardiac monitoring, CXR.   CBC: No leukocytosis or anemia,  CMP: Mild AKI w/ creatinine 1.90 & BUN 48 compared to prior 1.54 & 28 respectively. LFTs WNL. Hypercalcemia @ 11.9- possibly dehydration related - similar to prior. no other electrolyte derangement.  UA: no UTI. Ketonuria, proteinuria.  CXR: 1. Bibasilar pulmonary infiltrates, right side greater than left. Findings most consistent with pneumonia. Bibasilar pulmonary edema could also present in this fashion. 2. Cardiac pacer in stable position. Heart size normal. No pulmonary venous congestion.  Became somewhat tachycardic with orthostatic but maintained pressure. Did become lightheaded. Patient having difficulty ambulating with assistance. She states she feels much too weak to go home. Will consult hospitalist service for admission. Findings and plan of care discussed with supervising physician Dr. Vanita Panda who has evaluated patient & is in agreement.   16:48: CONSULT: Discussed with hospitalist Dr. Tyrell Antonio- accepts admission.   Deshanta Lady was evaluated in  Emergency Department on 12/17/2019 for the symptoms described in the history of present illness. He/she was evaluated in the context of the global COVID-19 pandemic, which necessitated consideration that the patient might be at risk for infection with the SARS-CoV-2 virus that causes COVID-19. Institutional protocols and algorithms that pertain to the evaluation of patients at risk for COVID-19 are in a state of rapid change based on information released by regulatory bodies including the CDC and federal and state organizations. These policies and algorithms were followed during the patient's care in the ED.   Final Clinical Impression(s) / ED Diagnoses Final diagnoses:  COVID-19  AKI (acute kidney injury) ([place])  Generalized weakness    Rx / DC Orders ED Discharge Orders    None       Amaryllis Dyke, PA-C 12/17/19 1654    Carmin Muskrat, MD 12/18/19 [phone]

## 2019-12-17 NOTE — ED Notes (Signed)
OVS done. PA notifed. Pt lightheaded when standing.

## 2019-12-17 NOTE — H&P (Signed)
History and Physical  Kylie Sanders DDU:[id number] DOB: October 21, 1951 DOA: 12/17/2019  PCP: Patient, No Pcp Per Patient coming from: Home   I have personally briefly reviewed patient's old medical records in [facility]   Chief Complaint: Generalized weakness, cough, SOB.  HPI: Kylie Sanders is a 69 y.o. female with PMH significant for COPD, HTN, OSA, MI, Pacemaker,  and chronic pain disorder who presents to the ED complaining of worsening generalized weakness, and dyspnea for 1 week.  She report nasal congestion, sore throat, cough, productive and clear sputum, body aches, chills.  She also reports emesis and diarrhea.  Patient was diagnosed with COVID-19 on 12-11-2020.  She feels so weak that she has not been able to ambulate.  She also reports mild abdominal pain, diffuse.  Evaluation in the ED: Sodium 137, potassium 4.0, BUN 48, creatinine 1.9, calcium 11.9, hemoglobin 14, white blood cell 4.7.  UA rare bacteria no significant white blood cell.  Chest x-ray: Bibasilar pulmonary infiltrates, right side greater than left. Findings most consistent with pneumonia. Bibasilar pulmonary edema could also present in this fashion.   Review of Systems: All systems reviewed and apart from history of presenting illness, are negative.  Past Medical History:  Diagnosis Date  . Asthma   . CHF (congestive heart failure) ([place])   . Diabetes mellitus without complication ([place])   . Hyperlipidemia   . Hypertension   . Myocardial infarction ([place])   . Seizures ([place])   . Stroke [facility])    Past Surgical History:  Procedure Laterality Date  . PACEMAKER IMPLANT     Social History:  reports that she has quit smoking. She has never used smokeless tobacco. She reports previous alcohol use. She reports that she does not use drugs.   No Known Allergies  Family history: DM and HTN father.   Prior to Admission medications   Medication Sig Start Date End Date Taking? Authorizing Provider  benzonatate  (TESSALON) 100 MG capsule Take 1 capsule (100 mg total) by mouth every 8 (eight) hours. 12/12/19   LampteyMyrene Galas, MD  HYDROcodone-acetaminophen (NORCO) 7.5-325 MG tablet Take 1 tablet by mouth every 6 (six) hours as needed for moderate pain. 09/16/19   Raylene Everts, MD  ibuprofen (ADVIL) 600 MG tablet Take 1 tablet (600 mg total) by mouth every 6 (six) hours as needed. 12/12/19   Chase Picket, MD  ondansetron (ZOFRAN ODT) 4 MG disintegrating tablet Take 1 tablet (4 mg total) by mouth every 8 (eight) hours as needed for nausea or vomiting. 12/12/19   Lamptey, Myrene Galas, MD  tiZANidine (ZANAFLEX) 4 MG tablet Take 1-2 tablets (4-8 mg total) by mouth every 6 (six) hours as needed for muscle spasms. 09/16/19   Raylene Everts, MD   Physical Exam: Vitals:   12/17/19 1500 12/17/19 1515 12/17/19 1530 12/17/19 1630  BP: 105/69  120/68 108/62  Pulse:  75 73 68  Resp: 19 (!) 27 (!) 28 (!) 23  Temp:      TempSrc:      SpO2:  93% 95% 95%  Weight:      Height:         General exam: obese, appears very weak.   Head, eyes and ENT: Nontraumatic and normocephalic. Pupils equally reacting to light and accommodation. Oral mucosa moist.  Neck: Supple. No JVD, carotid bruit or thyromegaly.  Lymphatics: No lymphadenopathy.  Respiratory system: Clear to auscultation. No increased work of breathing.  Cardiovascular system: S1 and S2  heard, RRR. No JVD, murmurs, gallops, clicks or pedal edema.  Gastrointestinal system: Abdomen is nondistended, soft and mild tender. Normal bowel sounds heard. No organomegaly or masses appreciated.  Central nervous system: Alert and oriented. No focal neurological deficits.  Extremities: Symmetric 5 x 5 power. Peripheral pulses symmetrically felt.   Skin: No rashes or acute findings.  Musculoskeletal system: Negative exam.  Psychiatry: Pleasant and cooperative.   Labs on Admission:  Basic Metabolic Panel: Recent Labs  Lab 12/12/19 1250  12/17/19 1241  NA 136 137  K 3.8 4.0  CL 104 103  CO2 24 22  GLUCOSE 95 113*  BUN 28* 48*  CREATININE 1.54* 1.90*  CALCIUM 11.8* 11.9*   Liver Function Tests: Recent Labs  Lab 12/17/19 1241  AST 21  ALT 15  ALKPHOS 73  BILITOT 0.9  PROT 8.2*  ALBUMIN 3.5   No results for input(s): LIPASE, AMYLASE in the last 168 hours. No results for input(s): AMMONIA in the last 168 hours. CBC: Recent Labs  Lab 12/12/19 1250 12/17/19 1241  WBC 3.2* 4.7  NEUTROABS  --  3.4  HGB 13.8 14.7  HCT 42.1 43.6  MCV 85.7 84.7  PLT 177 179   Cardiac Enzymes: No results for input(s): CKTOTAL, CKMB, CKMBINDEX, TROPONINI in the last 168 hours.  BNP (last 3 results) No results for input(s): PROBNP in the last 8760 hours. CBG: No results for input(s): GLUCAP in the last 168 hours.  Radiological Exams on Admission: DG Chest Portable 1 View  Result Date: 12/17/2019 CLINICAL DATA:  Shortness of breath.  Cough. EXAM: PORTABLE CHEST 1 VIEW COMPARISON:  12/12/2019. FINDINGS: Cardiac pacer with lead tip over the right atrium right ventricle. Heart size stable. No pulmonary venous congestion. Bibasilar pulmonary infiltrates, right side greater than left. No pleural effusion or pneumothorax. IMPRESSION: 1. Bibasilar pulmonary infiltrates, right side greater than left. Findings most consistent with pneumonia. Bibasilar pulmonary edema could also present in this fashion. 2. Cardiac pacer in stable position. Heart size normal. No pulmonary venous congestion. Electronically Signed   By: Maisie Fus  Register   On: 12/17/2019 13:18    EKG: Independently reviewed. 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 Patient presents with poor oral intake, vomiting and diarrhea.  Creatinine up to 1.9. Likely related to hypovolemia. Received 500 cc IV  fluids in the ED.  We will continue with maintenance fluids. Follow renal function test in the morning.  2-COVID-19 pneumonia: Patient presented with cough, shortness of breath, chest x-ray with bilateral infiltrates. We will start Remdesivir.  Currently she is not hypoxic, if she developed hypoxemia oxygen saturation below 94 will need to start dexamethasone. Check Inflammatory markers.   3-Chronic Diastolic Heart failure; Awaiting Med rec to be done.  Would hold lasix due to dehydration.   4-Gastroenteritis; secondary to covid. Support care.   5-Hypercalcemia: Continue with IV fluids    DVT Prophylaxis: Lovenox Code Status: Full code Family Communication: Care discussed with patient.  Disposition Plan: Admit under observation for hydration and treatment of COVID-19 pneumonia.  Time spent: 75 minutes.   Alba Cory MD [facility]   12/17/2019, 5:52 PM

## 2019-12-17 NOTE — ED Triage Notes (Signed)
Pt arrived POV for c/o weakness, SOB, generalized body achesx5days. Pt tested COVID pos at urgent care on Saturday. VS WNL. Pt is sinus rhythm, A-paced on monitor. Pt is A&Ox4. Pt is slow to answer questions. Pt c/o productive cough w/white, thick mucous.

## 2019-12-18 DIAGNOSIS — I130 Hypertensive heart and chronic kidney disease with heart failure and stage 1 through stage 4 chronic kidney disease, or unspecified chronic kidney disease: Secondary | ICD-10-CM | POA: Diagnosis not present

## 2019-12-18 DIAGNOSIS — J1282 Pneumonia due to coronavirus disease 2019: Secondary | ICD-10-CM

## 2019-12-18 DIAGNOSIS — Z950 Presence of cardiac pacemaker: Secondary | ICD-10-CM | POA: Diagnosis not present

## 2019-12-18 DIAGNOSIS — Z833 Family history of diabetes mellitus: Secondary | ICD-10-CM | POA: Diagnosis not present

## 2019-12-18 DIAGNOSIS — I252 Old myocardial infarction: Secondary | ICD-10-CM | POA: Diagnosis not present

## 2019-12-18 DIAGNOSIS — J440 Chronic obstructive pulmonary disease with acute lower respiratory infection: Secondary | ICD-10-CM | POA: Diagnosis not present

## 2019-12-18 DIAGNOSIS — Z79899 Other long term (current) drug therapy: Secondary | ICD-10-CM | POA: Diagnosis not present

## 2019-12-18 DIAGNOSIS — Z993 Dependence on wheelchair: Secondary | ICD-10-CM | POA: Diagnosis not present

## 2019-12-18 DIAGNOSIS — E1122 Type 2 diabetes mellitus with diabetic chronic kidney disease: Secondary | ICD-10-CM | POA: Diagnosis not present

## 2019-12-18 DIAGNOSIS — U071 COVID-19: Secondary | ICD-10-CM | POA: Diagnosis present

## 2019-12-18 DIAGNOSIS — Z8673 Personal history of transient ischemic attack (TIA), and cerebral infarction without residual deficits: Secondary | ICD-10-CM | POA: Diagnosis not present

## 2019-12-18 DIAGNOSIS — N179 Acute kidney failure, unspecified: Secondary | ICD-10-CM

## 2019-12-18 DIAGNOSIS — A0839 Other viral enteritis: Secondary | ICD-10-CM | POA: Diagnosis not present

## 2019-12-18 DIAGNOSIS — N182 Chronic kidney disease, stage 2 (mild): Secondary | ICD-10-CM | POA: Diagnosis not present

## 2019-12-18 DIAGNOSIS — I5032 Chronic diastolic (congestive) heart failure: Secondary | ICD-10-CM | POA: Diagnosis not present

## 2019-12-18 DIAGNOSIS — E860 Dehydration: Secondary | ICD-10-CM | POA: Diagnosis not present

## 2019-12-18 DIAGNOSIS — Z79891 Long term (current) use of opiate analgesic: Secondary | ICD-10-CM | POA: Diagnosis not present

## 2019-12-18 DIAGNOSIS — E861 Hypovolemia: Secondary | ICD-10-CM | POA: Diagnosis not present

## 2019-12-18 DIAGNOSIS — G4733 Obstructive sleep apnea (adult) (pediatric): Secondary | ICD-10-CM | POA: Diagnosis not present

## 2019-12-18 DIAGNOSIS — Z8249 Family history of ischemic heart disease and other diseases of the circulatory system: Secondary | ICD-10-CM | POA: Diagnosis not present

## 2019-12-18 DIAGNOSIS — R531 Weakness: Secondary | ICD-10-CM | POA: Diagnosis present

## 2019-12-18 DIAGNOSIS — Z87891 Personal history of nicotine dependence: Secondary | ICD-10-CM | POA: Diagnosis not present

## 2019-12-18 DIAGNOSIS — G8929 Other chronic pain: Secondary | ICD-10-CM | POA: Diagnosis not present

## 2019-12-18 LAB — COMPREHENSIVE METABOLIC PANEL
ALT: 13 U/L (ref 0–44)
AST: 18 U/L (ref 15–41)
Albumin: 2.8 g/dL — ABNORMAL LOW (ref 3.5–5.0)
Alkaline Phosphatase: 56 U/L (ref 38–126)
Anion gap: 13 (ref 5–15)
BUN: 39 mg/dL — ABNORMAL HIGH (ref 8–23)
CO2: 19 mmol/L — ABNORMAL LOW (ref 22–32)
Calcium: 11 mg/dL — ABNORMAL HIGH (ref 8.9–10.3)
Chloride: 107 mmol/L (ref 98–111)
Creatinine, Ser: 1.23 mg/dL — ABNORMAL HIGH (ref 0.44–1.00)
GFR calc Af Amer: 53 mL/min — ABNORMAL LOW (ref >60)
GFR calc non Af Amer: 46 mL/min — ABNORMAL LOW (ref >60)
Glucose, Bld: 92 mg/dL (ref 70–99)
Potassium: 4.2 mmol/L (ref 3.5–5.1)
Sodium: 139 mmol/L (ref 135–145)
Total Bilirubin: 0.5 mg/dL (ref 0.3–1.2)
Total Protein: 6.9 g/dL (ref 6.5–8.1)

## 2019-12-18 LAB — FERRITIN: Ferritin: 496 ng/mL — ABNORMAL HIGH (ref 11–307)

## 2019-12-18 LAB — C-REACTIVE PROTEIN: CRP: 10.5 mg/dL — ABNORMAL HIGH (ref <1.0)

## 2019-12-18 LAB — D-DIMER, QUANTITATIVE: D-Dimer, Quant: 0.88 ug/mL-FEU — ABNORMAL HIGH (ref 0.00–0.50)

## 2019-12-18 LAB — MAGNESIUM: Magnesium: 1.7 mg/dL (ref 1.7–2.4)

## 2019-12-18 MED ORDER — ALBUTEROL SULFATE HFA 108 (90 BASE) MCG/ACT IN AERS
2.0000 | INHALATION_SPRAY | RESPIRATORY_TRACT | Status: DC | PRN
  Administered 2019-12-19 (×2): 2 via RESPIRATORY_TRACT

## 2019-12-18 MED ORDER — ENSURE ENLIVE PO LIQD
237.0000 mL | Freq: Two times a day (BID) | ORAL | Status: DC
  Administered 2019-12-18 – 2019-12-20 (×3): 237 mL via ORAL
  Filled 2019-12-18: qty 237

## 2019-12-18 NOTE — Progress Notes (Signed)
PT Cancellation Note  Patient Details Name: Kaytlin Burklow MRN: [id number] DOB: 1951-06-10   Cancelled Treatment:    Reason Eval/Treat Not Completed: Patient at procedure or test/unavailable   Attempted to see twice today and both times being cleaned up by nursing. Will reattempt as schedule allows   Jerolyn Center, PT Pager [phone]    Zena Amos 12/18/2019, 1:31 PM

## 2019-12-18 NOTE — Progress Notes (Signed)
Initial Nutrition Assessment  RD working remotely.  DOCUMENTATION CODES:   Not applicable  INTERVENTION:   - Liberalize diet to Regular, verbal with readback order placed per MD  - Ensure Enlive po BID, each supplement provides 350 kcal and 20 grams of protein  NUTRITION DIAGNOSIS:   Increased nutrient needs related to acute illness (COVID-19) as evidenced by estimated needs.  GOAL:   Patient will meet greater than or equal to 90% of their needs  MONITOR:   PO intake, Supplement acceptance, Labs, Weight trends  REASON FOR ASSESSMENT:   Malnutrition Screening Tool    ASSESSMENT:   69 year old female who presented to the ED on 1/05 with weakness, SOB, body aches. PMH of COPD, HTN, OSA, HLD, DM, CHF, and chronic pain disorder. Pt with diagnosis of COVID-19 on 12/12/19.   Unable to reach pt via phone call to room. No weight history available in chart. Weight of 145 lbs on admission appears stated rather than measured.  RD will order oral nutrition supplements to aid pt in meeting kcal and protein needs during admission.  Per RN edema assessment, pt with mild pitting generalized edema.  Meal Completion: 90%  Medications reviewed and include: vitamin C, zinc sulfate, remdesivir IVF: LR @ 100 ml/hr  Labs reviewed.  NUTRITION - FOCUSED PHYSICAL EXAM:  Unable to complete at this time. RD working remotely.  Diet Order:   Diet Order            Diet regular Room service appropriate? Yes; Fluid consistency: Thin  Diet effective now              EDUCATION NEEDS:   No education needs have been identified at this time  Skin:  Skin Assessment: Reviewed RN Assessment  Last BM:  no documented BM  Height:   Ht Readings from Last 1 Encounters:  12/17/19 5\' 4"  (1.626 m)    Weight:   Wt Readings from Last 1 Encounters:  12/17/19 65.8 kg    Ideal Body Weight:  54.5 kg  BMI:  Body mass index is 24.89 kg/m.  Estimated Nutritional Needs:   Kcal:   1700-1900  Protein:  75-90 grams  Fluid:  >/= 1.7 L    02/14/20, MS, RD, LDN Inpatient Clinical Dietitian Pager: [phone] Weekend/After Hours: [phone]

## 2019-12-18 NOTE — Progress Notes (Signed)
PROGRESS NOTE    Kylie Sanders  OIZ:[id number] DOB: 05-26-1951 DOA: 12/17/2019 PCP: Patient, No Pcp Per   Brief Narrative:  Kylie Sanders is a 69 y.o. female with PMH significant for COPD, HTN, OSA, MI, Pacemaker,  and chronic pain disorder who presents to the ED complaining of worsening generalized weakness, and dyspnea for 1 week.  She report nasal congestion, sore throat, cough, productive and clear sputum, body aches, chills.  She also reports emesis and diarrhea.  Patient was diagnosed with COVID-19 on 12-11-2020.  She feels so weak that she has not been able to ambulate.  She also reports mild abdominal pain, diffuse. Scr noted to elevated, patient dehydrated.  UA with rare bacteria, CXR with bl infiltrates. She has persistent diarrhea.  Has nausea.   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 Patient presented with poor oral intake, vomiting and diarrhea.  Creatinine up to 1.9. Likely related to hypovolemia. Received 500 cc IV fluids in the ED and was continued on IV fluids.  Scr improving but still appears euvolemic.  Monitor UOP Continue IV fluids Repeat labs in am.   2-COVID-19 pneumonia: Patient presented with cough, shortness of breath, chest x-ray with bilateral infiltrates. Will continue Remdesivir.  Currently she is not hypoxic, if she developed hypoxemia oxygen saturation below 94 will need to start dexamethasone. Check Inflammatory markers.  Repeat CXR in am and eval for need of abx for possible superimposed bacterial pneumonia.   3-Chronic Diastolic Heart failure; hold lasix due to dehydration.  Appears euvolemic  4-Gastroenteritis; thought to be secondary to covid but will check stool for C diff.  Monitor.    5-Hypercalcemia: Continue with IV fluids    DVT  Prophylaxis: Lovenox Code Status: Full code Family Communication: Care discussed with patient.  Disposition Plan: Admit to inpatient with expected at least 2MN stay.    Consultants:   None  Procedures:   None   Antimicrobials:   None   Subjective: She feels tired, continues to have diarrhea but denies abdominal pain.   Objective: Vitals:   12/17/19 2054 12/18/19 0436 12/18/19 0805 12/18/19 1707  BP: 116/73 97/69 106/72 100/79  Pulse: 80 61  61  Resp: (!) 21 13 16 19   Temp: 99.3 F (37.4 C) 98.6 F (37 C)  98.7 F (37.1 C)  TempSrc: Oral Oral  Oral  SpO2: 100% 96% 91% 97%  Weight:      Height:        Intake/Output Summary (Last 24 hours) at 12/18/2019 1800 Last data filed at 12/18/2019 0900 Gross per 24 hour  Intake 730.33 ml  Output 900 ml  Net -169.67 ml   Filed Weights   12/17/19 1246  Weight: 65.8 kg    Examination:  General exam: Appears calm and comfortable. Tired appearing.  Respiratory system: No respiratory distress, no wheezing.  Cardiovascular system: S1 & S2, RRR.  Gastrointestinal system: Abdomen is nondistended, soft and nontender.  Central nervous system: Alert and oriented x3. Extremities: Symmetric 5 x 5 power. Skin: No rashes, lesions or ulcers Psychiatry: Mood & affect appropriate.     Data Reviewed: I have personally reviewed following labs and imaging studies  CBC: Recent Labs  Lab 12/12/19 1250 12/17/19 1241 12/17/19 1750  WBC 3.2* 4.7 4.2  NEUTROABS  --  3.4  --   HGB 13.8 14.7 13.8  HCT 42.1 43.6 42.7  MCV 85.7 84.7 85.6  PLT 177 179 170   Basic Metabolic Panel: Recent Labs  Lab 12/12/19 1250 12/17/19 1241 12/17/19 1750 12/18/19 0645  NA 136 137  --  139  K 3.8 4.0  --  4.2  CL 104 103  --  107  CO2 24 22  --  19*  GLUCOSE 95 113*  --  92  BUN 28* 48*  --  39*  CREATININE 1.54* 1.90* 1.64* 1.23*  CALCIUM 11.8* 11.9*  --  11.0*  MG  --   --   --  1.7   GFR: Estimated Creatinine Clearance: 42 mL/min (A)  (by C-G formula based on SCr of 1.23 mg/dL (H)). Liver Function Tests: Recent Labs  Lab 12/17/19 1241 12/18/19 0645  AST 21 18  ALT 15 13  ALKPHOS 73 56  BILITOT 0.9 0.5  PROT 8.2* 6.9  ALBUMIN 3.5 2.8*   No results for input(s): LIPASE, AMYLASE in the last 168 hours. No results for input(s): AMMONIA in the last 168 hours. Coagulation Profile: No results for input(s): INR, PROTIME in the last 168 hours. Cardiac Enzymes: No results for input(s): CKTOTAL, CKMB, CKMBINDEX, TROPONINI in the last 168 hours. BNP (last 3 results) No results for input(s): PROBNP in the last 8760 hours. HbA1C: No results for input(s): HGBA1C in the last 72 hours. CBG: No results for input(s): GLUCAP in the last 168 hours. Lipid Profile: No results for input(s): CHOL, HDL, LDLCALC, TRIG, CHOLHDL, LDLDIRECT in the last 72 hours. Thyroid Function Tests: No results for input(s): TSH, T4TOTAL, FREET4, T3FREE, THYROIDAB in the last 72 hours. Anemia Panel: Recent Labs    12/17/19 1750 12/18/19 0645  FERRITIN 557* 496*   Sepsis Labs: No results for input(s): PROCALCITON, LATICACIDVEN in the last 168 hours.  No results found for this or any previous visit (from the past 240 hour(s)).       Radiology Studies: DG Chest Portable 1 View  Result Date: 12/17/2019 CLINICAL DATA:  Shortness of breath.  Cough. EXAM: PORTABLE CHEST 1 VIEW COMPARISON:  12/12/2019. FINDINGS: Cardiac pacer with lead tip over the right atrium right ventricle. Heart size stable. No pulmonary venous congestion. Bibasilar pulmonary infiltrates, right side greater than left. No pleural effusion or pneumothorax. IMPRESSION: 1. Bibasilar pulmonary infiltrates, right side greater than left. Findings most consistent with pneumonia. Bibasilar pulmonary edema could also present in this fashion. 2. Cardiac pacer in stable position. Heart size normal. No pulmonary venous congestion. Electronically Signed   By: Maisie Fus  Register   On: 12/17/2019  13:18        Scheduled Meds: . albuterol  2 puff Inhalation Q6H  . vitamin C  500 mg Oral Daily  . benzonatate  100 mg Oral Q8H  . enoxaparin (LOVENOX) injection  40 mg Subcutaneous Q24H  . feeding supplement (ENSURE ENLIVE)  237 mL Oral BID BM  . zinc sulfate  220 mg Oral Daily   Continuous Infusions: . lactated ringers 100 mL/hr at 12/18/19 0516  . remdesivir 100 mg in NS 100 mL       LOS: 0 days    Time spent: 35 minutes    Ky Barban, MD [facility]   If 7PM-7AM, please contact night-coverage [email or web address] Password [email or web address] 12/18/2019, 6:00 PM

## 2019-12-18 NOTE — Evaluation (Signed)
Occupational Therapy Evaluation Patient Details Name: Kylie Sanders MRN: [id number] DOB: 06/24/1951 Today's Date: 12/18/2019    History of Present Illness 69 y.o. female with PMH significant for COPD, HTN, OSA, MI, Pacemaker, DM, stroke, and chronic pain disorder who presented 12/17/19 to the ED complaining of worsening generalized weakness, and dyspnea for 1 week. She also reports emesis and diarrhea. COVIID + 12/12/19; +pna   Clinical Impression   PTA pt living with daughter and reports using RW and wheelchair for functional mobility with intermittent assist for BADL/IADL. Pt does not give detailed PLOF, will need to continue to assess. At time of eval, pt is in bed soiled and able to be aware of this. Educated on [facility] usage with help of staff, pt states she prefers an extra pad under her instead. She required min A for bed mobility and mod A for sit <> stand transfers. Pt stood for toilet hygiene completed at total A so she could maintain standing balance with RW. Pt requiring x1 rest break during hygiene, with poor activity tolerance for BADL overall. She presents with cognitive impairments, such as not answering commands, easily becoming agitated, and needing increased processing time. Given current status, recommending SNF at d/c unless family can provide 24/7 physical assist for pt at home. Will continue to follow per POC listed below.    Follow Up Recommendations  SNF;Supervision/Assistance - 24 hour;Other (comment)(unless family can provide 24/7 physical assist with HHOT in home)    Equipment Recommendations  3 in 1 bedside commode    Recommendations for Other Services       Precautions / Restrictions Precautions Precautions: Fall Restrictions Weight Bearing Restrictions: No      Mobility Bed Mobility Overal bed mobility: Needs Assistance Bed Mobility: Supine to Sit     Supine to sit: Min assist     General bed mobility comments: min A for truncal support and to move hips  to EOB  Transfers Overall transfer level: Needs assistance Equipment used: Rolling walker (2 wheeled) Transfers: Sit to/from Stand Sit to Stand: Mod assist         General transfer comment: mod A to rise and steady with use of RW, fatigues easily with sit <> stands    Balance Overall balance assessment: Needs assistance Sitting-balance support: Bilateral upper extremity supported;Feet supported Sitting balance-Leahy Scale: Fair       Standing balance-Leahy Scale: Poor Standing balance comment: reliant on external support from RW and therapist                           ADL either performed or assessed with clinical judgement   ADL Overall ADL's : Needs assistance/impaired Eating/Feeding: Set up;Sitting   Grooming: Set up;Sitting   Upper Body Bathing: Minimal assistance;Sitting   Lower Body Bathing: Moderate assistance;Sit to/from stand;Sitting/lateral leans   Upper Body Dressing : Minimal assistance;Sitting   Lower Body Dressing: Moderate assistance;Sitting/lateral leans;Sit to/from stand   Toilet Transfer: Moderate assistance;Stand-pivot;RW Toilet Transfer Details (indicate cue type and reason): fatigues easily Toileting- Clothing Manipulation and Hygiene: Total assistance;Sit to/from stand Toileting - Clothing Manipulation Details (indicate cue type and reason): total A for peri care while standing EOB during session. Upon arrival pt states she is soiled in bed, but did not call to attempt to go to bathroom Tub/ Shower Transfer: Moderate assistance;Ambulation;Shower seat;Rolling walker   Functional mobility during ADLs: Minimal assistance;Moderate assistance;Cueing for safety;Rolling walker General ADL Comments: pt limited by cognitive deficits, generalized weakness, and  limited activity tolerance for safe and independent BADL     Vision   Vision Assessment?: No apparent visual deficits     Perception     Praxis      Pertinent Vitals/Pain Pain  Assessment: No/denies pain     Hand Dominance     Extremity/Trunk Assessment Upper Extremity Assessment Upper Extremity Assessment: Generalized weakness   Lower Extremity Assessment Lower Extremity Assessment: Generalized weakness       Communication Communication Communication: No difficulties   Cognition Arousal/Alertness: Awake/alert Behavior During Therapy: Flat affect Overall Cognitive Status: No family/caregiver present to determine baseline cognitive functioning Area of Impairment: Attention;Following commands;Safety/judgement;Problem solving                   Current Attention Level: Sustained   Following Commands: Follows one step commands inconsistently;Follows one step commands with increased time Safety/Judgement: Decreased awareness of deficits;Decreased awareness of safety   Problem Solving: Slow processing;Requires verbal cues General Comments: pt presents as self limiting with BADL, laying in soiled bed and seemed aware of this. States to put another pad under her than to help her go to the bathroom. Did not provide clear history, when asked detailed/ open ended questions pt would not respond   General Comments       Exercises     Shoulder Instructions      Home Living Family/patient expects to be discharged to:: Private residence Living Arrangements: Children(dtr)                           Home Equipment: Gilford Rile - 2 wheels;Wheelchair - manual   Additional Comments: pt very limited with sharing home information, will need to continue to assess      Prior Functioning/Environment Level of Independence: Needs assistance  Gait / Transfers Assistance Needed: used RW and wheelchair ADL's / Homemaking Assistance Needed: reports dtr assisted, but did not specify as to how much            OT Problem List: Decreased strength;Decreased knowledge of use of DME or AE;Decreased activity tolerance;Decreased cognition;Cardiopulmonary status  limiting activity;Impaired balance (sitting and/or standing)      OT Treatment/Interventions: Self-care/ADL training;Therapeutic exercise;Patient/family education;Balance training;Energy conservation;Therapeutic activities;DME and/or AE instruction;Cognitive remediation/compensation    OT Goals(Current goals can be found in the care plan section) Acute Rehab OT Goals Patient Stated Goal: to go home soon OT Goal Formulation: With patient Time For Goal Achievement: 01/01/20 Potential to Achieve Goals: Good  OT Frequency: Min 2X/week   Barriers to D/C:            Co-evaluation              AM-PAC OT "6 Clicks" Daily Activity     Outcome Measure Help from another person eating meals?: A Little Help from another person taking care of personal grooming?: A Little Help from another person toileting, which includes using toliet, bedpan, or urinal?: A Lot Help from another person bathing (including washing, rinsing, drying)?: A Lot Help from another person to put on and taking off regular upper body clothing?: A Little Help from another person to put on and taking off regular lower body clothing?: A Lot 6 Click Score: 15   End of Session Equipment Utilized During Treatment: Gait belt;Rolling walker Nurse Communication: Mobility status  Activity Tolerance: Patient tolerated treatment well Patient left: in chair;with call bell/phone within reach;with chair alarm set  OT Visit Diagnosis: Unsteadiness on feet (R26.81);Other abnormalities of gait  and mobility (R26.89);Muscle weakness (generalized) (M62.81);Other symptoms and signs involving cognitive function                Time: 1459-1533 OT Time Calculation (min): 34 min Charges:  OT General Charges $OT Visit: 1 Visit OT Evaluation $OT Eval Moderate Complexity: 1 Mod OT Treatments $Self Care/Home Management : 8-22 mins  Dalphine Handing, MSOT, OTR/L Acute Rehabilitation Services [facility] Office Number: [phone] Dalphine Handing 12/18/2019, 5:23 PM

## 2019-12-19 ENCOUNTER — Inpatient Hospital Stay (HOSPITAL_COMMUNITY): Payer: Medicare Other

## 2019-12-19 DIAGNOSIS — R531 Weakness: Secondary | ICD-10-CM | POA: Diagnosis not present

## 2019-12-19 DIAGNOSIS — N179 Acute kidney failure, unspecified: Secondary | ICD-10-CM | POA: Diagnosis not present

## 2019-12-19 DIAGNOSIS — U071 COVID-19: Secondary | ICD-10-CM | POA: Diagnosis not present

## 2019-12-19 DIAGNOSIS — J1282 Pneumonia due to coronavirus disease 2019: Secondary | ICD-10-CM | POA: Diagnosis not present

## 2019-12-19 LAB — COMPREHENSIVE METABOLIC PANEL
ALT: 14 U/L (ref 0–44)
AST: 16 U/L (ref 15–41)
Albumin: 2.7 g/dL — ABNORMAL LOW (ref 3.5–5.0)
Alkaline Phosphatase: 50 U/L (ref 38–126)
Anion gap: 11 (ref 5–15)
BUN: 32 mg/dL — ABNORMAL HIGH (ref 8–23)
CO2: 22 mmol/L (ref 22–32)
Calcium: 10.9 mg/dL — ABNORMAL HIGH (ref 8.9–10.3)
Chloride: 107 mmol/L (ref 98–111)
Creatinine, Ser: 1.14 mg/dL — ABNORMAL HIGH (ref 0.44–1.00)
GFR calc Af Amer: 58 mL/min — ABNORMAL LOW (ref >60)
GFR calc non Af Amer: 50 mL/min — ABNORMAL LOW (ref >60)
Glucose, Bld: 88 mg/dL (ref 70–99)
Potassium: 3.9 mmol/L (ref 3.5–5.1)
Sodium: 140 mmol/L (ref 135–145)
Total Bilirubin: 0.2 mg/dL — ABNORMAL LOW (ref 0.3–1.2)
Total Protein: 6.2 g/dL — ABNORMAL LOW (ref 6.5–8.1)

## 2019-12-19 LAB — MAGNESIUM: Magnesium: 1.5 mg/dL — ABNORMAL LOW (ref 1.7–2.4)

## 2019-12-19 LAB — FERRITIN: Ferritin: 519 ng/mL — ABNORMAL HIGH (ref 11–307)

## 2019-12-19 LAB — C-REACTIVE PROTEIN: CRP: 8.4 mg/dL — ABNORMAL HIGH (ref <1.0)

## 2019-12-19 LAB — D-DIMER, QUANTITATIVE: D-Dimer, Quant: 0.85 ug/mL-FEU — ABNORMAL HIGH (ref 0.00–0.50)

## 2019-12-19 MED ORDER — LORAZEPAM 2 MG/ML IJ SOLN
1.0000 mg | Freq: Once | INTRAMUSCULAR | Status: AC
  Administered 2019-12-19: 1 mg via INTRAVENOUS
  Filled 2019-12-19: qty 1

## 2019-12-19 MED ORDER — SODIUM CHLORIDE 0.9 % IV SOLN
INTRAVENOUS | Status: DC
  Administered 2019-12-20: 09:00:00 1000 mL via INTRAVENOUS

## 2019-12-19 MED ORDER — SALINE SPRAY 0.65 % NA SOLN
1.0000 | NASAL | Status: DC | PRN
  Administered 2019-12-19 – 2019-12-20 (×2): 1 via NASAL
  Filled 2019-12-19: qty 44

## 2019-12-19 MED ORDER — FLUTICASONE PROPIONATE 50 MCG/ACT NA SUSP
2.0000 | Freq: Every day | NASAL | Status: DC
  Administered 2019-12-19 – 2019-12-22 (×4): 2 via NASAL
  Filled 2019-12-19: qty 16

## 2019-12-19 NOTE — Progress Notes (Signed)
Patient has not has any BM since yesterday, per MD OK to d/c cdiff panel and enteric precautions.

## 2019-12-19 NOTE — Progress Notes (Signed)
PROGRESS NOTE    [name]  QMG:[id number] DOB: 1951/11/20 DOA: 12/17/2019 PCP: Patient, No Pcp Per   Brief Narrative:  Kylie Sanders is a 69 y.o. female with PMH significant for COPD, HTN, OSA, MI, Pacemaker,  and chronic pain disorder who presents to the ED complaining of worsening generalized weakness, and dyspnea for 1 week.  She report nasal congestion, sore throat, cough, productive and clear sputum, body aches, chills.  She also reports emesis and diarrhea.  Patient was diagnosed with COVID-19 on 12-11-2020.  She feels so weak that she has not been able to ambulate.  She also reports mild abdominal pain, diffuse. Scr noted to elevated, patient dehydrated.  UA with rare bacteria, CXR with bl infiltrates. She has persistent diarrhea.  Has nausea.   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 Patient presented with poor oral intake, vomiting and diarrhea.  Creatinine up to 1.9 on presentation. Likely related to hypovolemia. Scr improving with IV fluids which will be continued Monitor UOP Repeat labs in am.   2-COVID-19 pneumonia: Patient presented with cough, shortness of breath, chest x-ray with bilateral infiltrates. Will continue Remdesivir.  Currently she is not hypoxic, if she developed hypoxemia oxygen saturation below 94 will need to start dexamethasone. Monitor Inflammatory markers.  Repeat CXR today with no consolidation Start flonase and saline nasal spray for nasal congestion  3-Chronic Diastolic Heart failure; hold lasix due to dehydration.  Appears euvolemic  4-Gastroenteritis; thought to be secondary to covid. No longer having diarrhea. C diff stool test cancelled. Enteric precautions dcd.   5-Hypercalcemia: Improving. Continue with IV fluids    DVT  Prophylaxis: Lovenox Code Status: Full code Family Communication: Care discussed with patient.  Disposition Plan: Continue IP care, trend Scr due to AKI.     Consultants:   None  Procedures:   None   Antimicrobials:   None   Subjective: She continues to feel tired but slightly better. Has nasal congestion.    Objective: Vitals:   12/18/19 0805 12/18/19 1707 12/18/19 2117 12/19/19 0603  BP: 106/72 100/79 100/67 (!) 117/97  Pulse:  61 67 61  Resp: 16 19 17 19   Temp:  98.7 F (37.1 C) 98 F (36.7 C) 98.7 F (37.1 C)  TempSrc:  Oral Oral Oral  SpO2: 91% 97% 97% 93%  Weight:      Height:        Intake/Output Summary (Last 24 hours) at 12/19/2019 1707 Last data filed at 12/18/2019 2000 Gross per 24 hour  Intake -  Output 200 ml  Net -200 ml   Filed Weights   12/17/19 1246  Weight: 65.8 kg    Examination:  General exam: Appears calm and comfortable. Tired appearing.  Respiratory system: No respiratory distress, no wheezing.  Cardiovascular system: S1 & S2, RRR.  Gastrointestinal system: Abdomen is nondistended, soft and nontender.  Central nervous system: Alert and oriented x3. Extremities: Symmetric 5 x 5 power. Skin: No rashes, lesions or ulcers Psychiatry: Guarded affect.     Data Reviewed: I have personally reviewed following labs and imaging studies  CBC: Recent Labs  Lab 12/17/19 1241 12/17/19 1750  WBC 4.7 4.2  NEUTROABS 3.4  --   HGB 14.7 13.8  HCT 43.6 42.7  MCV 84.7 85.6  PLT 179 170  Basic Metabolic Panel: Recent Labs  Lab 12/17/19 1241 12/17/19 1750 12/18/19 0645 12/19/19 0344  NA 137  --  139 140  K 4.0  --  4.2 3.9  CL 103  --  107 107  CO2 22  --  19* 22  GLUCOSE 113*  --  92 88  BUN 48*  --  39* 32*  CREATININE 1.90* 1.64* 1.23* 1.14*  CALCIUM 11.9*  --  11.0* 10.9*  MG  --   --  1.7 1.5*   GFR: Estimated Creatinine Clearance: 45.3 mL/min (A) (by C-G formula based on SCr of 1.14 mg/dL (H)). Liver Function Tests:  Recent Labs  Lab 12/17/19 1241 12/18/19 0645 12/19/19 0344  AST 21 18 16   ALT 15 13 14   ALKPHOS 73 56 50  BILITOT 0.9 0.5 0.2*  PROT 8.2* 6.9 6.2*  ALBUMIN 3.5 2.8* 2.7*   No results for input(s): LIPASE, AMYLASE in the last 168 hours. No results for input(s): AMMONIA in the last 168 hours. Coagulation Profile: No results for input(s): INR, PROTIME in the last 168 hours. Cardiac Enzymes: No results for input(s): CKTOTAL, CKMB, CKMBINDEX, TROPONINI in the last 168 hours. BNP (last 3 results) No results for input(s): PROBNP in the last 8760 hours. HbA1C: No results for input(s): HGBA1C in the last 72 hours. CBG: No results for input(s): GLUCAP in the last 168 hours. Lipid Profile: No results for input(s): CHOL, HDL, LDLCALC, TRIG, CHOLHDL, LDLDIRECT in the last 72 hours. Thyroid Function Tests: No results for input(s): TSH, T4TOTAL, FREET4, T3FREE, THYROIDAB in the last 72 hours. Anemia Panel: Recent Labs    12/18/19 0645 12/19/19 0344  FERRITIN 496* 519*   Sepsis Labs: No results for input(s): PROCALCITON, LATICACIDVEN in the last 168 hours.  No results found for this or any previous visit (from the past 240 hour(s)).       Radiology Studies: DG CHEST PORT 1 VIEW  Result Date: 12/19/2019 CLINICAL DATA:  Shortness of breath.  COVID-19 infection. EXAM: PORTABLE CHEST 1 VIEW COMPARISON:  12/17/2019; 12/12/2019 FINDINGS: Grossly unchanged cardiac silhouette and mediastinal contours with atherosclerotic plaque within the thoracic aorta. Stable position of support apparatus. Overall improved aeration of the lungs with minimal residual bibasilar opacities perihilar new focal airspace opacities. The lungs remain hyperexpanded with flat diaphragms and thinning of the biapical pulmonary parenchyma. No acute osseous abnormalities. IMPRESSION: Overall improved aeration of the lungs with minimal residual bibasilar atelectasis. Electronically Signed   By: Sandi Mariscal M.D.   On:  12/19/2019 08:08        Scheduled Meds: . vitamin C  500 mg Oral Daily  . benzonatate  100 mg Oral Q8H  . enoxaparin (LOVENOX) injection  40 mg Subcutaneous Q24H  . feeding supplement (ENSURE ENLIVE)  237 mL Oral BID BM  . fluticasone  2 spray Each Nare Daily  . zinc sulfate  220 mg Oral Daily   Continuous Infusions: . lactated ringers 100 mL/hr at 12/19/19 0153  . remdesivir 100 mg in NS 100 mL 100 mg (12/18/19 2000)     LOS: 1 day    Time spent: 35 minutes    Blain Pais, MD [facility]   If 7PM-7AM, please contact night-coverage [email or web address] Password [facility] 12/19/2019, 5:07 PM

## 2019-12-20 DIAGNOSIS — U071 COVID-19: Secondary | ICD-10-CM | POA: Diagnosis not present

## 2019-12-20 DIAGNOSIS — R531 Weakness: Secondary | ICD-10-CM | POA: Diagnosis not present

## 2019-12-20 DIAGNOSIS — N179 Acute kidney failure, unspecified: Secondary | ICD-10-CM | POA: Diagnosis not present

## 2019-12-20 DIAGNOSIS — J1282 Pneumonia due to coronavirus disease 2019: Secondary | ICD-10-CM | POA: Diagnosis not present

## 2019-12-20 LAB — COMPREHENSIVE METABOLIC PANEL
ALT: 15 U/L (ref 0–44)
AST: 17 U/L (ref 15–41)
Albumin: 2.8 g/dL — ABNORMAL LOW (ref 3.5–5.0)
Alkaline Phosphatase: 51 U/L (ref 38–126)
Anion gap: 11 (ref 5–15)
BUN: 18 mg/dL (ref 8–23)
CO2: 24 mmol/L (ref 22–32)
Calcium: 10.6 mg/dL — ABNORMAL HIGH (ref 8.9–10.3)
Chloride: 108 mmol/L (ref 98–111)
Creatinine, Ser: 1.02 mg/dL — ABNORMAL HIGH (ref 0.44–1.00)
GFR calc Af Amer: >60 mL/min (ref >60)
GFR calc non Af Amer: 57 mL/min — ABNORMAL LOW (ref >60)
Glucose, Bld: 98 mg/dL (ref 70–99)
Potassium: 3.7 mmol/L (ref 3.5–5.1)
Sodium: 143 mmol/L (ref 135–145)
Total Bilirubin: 0.8 mg/dL (ref 0.3–1.2)
Total Protein: 6.2 g/dL — ABNORMAL LOW (ref 6.5–8.1)

## 2019-12-20 LAB — MAGNESIUM: Magnesium: 1.4 mg/dL — ABNORMAL LOW (ref 1.7–2.4)

## 2019-12-20 LAB — FERRITIN: Ferritin: 716 ng/mL — ABNORMAL HIGH (ref 11–307)

## 2019-12-20 LAB — D-DIMER, QUANTITATIVE: D-Dimer, Quant: 0.73 ug/mL-FEU — ABNORMAL HIGH (ref 0.00–0.50)

## 2019-12-20 LAB — C-REACTIVE PROTEIN: CRP: 4.2 mg/dL — ABNORMAL HIGH (ref <1.0)

## 2019-12-20 MED ORDER — MAGNESIUM SULFATE 2 GM/50ML IV SOLN
2.0000 g | Freq: Once | INTRAVENOUS | Status: AC
  Administered 2019-12-20: 2 g via INTRAVENOUS
  Filled 2019-12-20: qty 50

## 2019-12-20 NOTE — Progress Notes (Signed)
PROGRESS NOTE    Kylie Sanders  BHA:[id number] DOB: 21-Jan-1951 DOA: 12/17/2019 PCP: Patient, No Pcp Per   Brief Narrative:  Kylie Sanders is a 69 y.o. female with PMH significant for COPD, HTN, OSA, MI, Pacemaker,  and chronic pain disorder who presents to the ED complaining of worsening generalized weakness, and dyspnea for 1 week.  She report nasal congestion, sore throat, cough, productive and clear sputum, body aches, chills.  She also reports emesis and diarrhea.  Patient was diagnosed with COVID-19 on 12-11-2020.  She feels so weak that she has not been able to ambulate.  She also reports mild abdominal pain, diffuse. Scr noted to elevated, patient dehydrated.  UA with rare bacteria, CXR with bl infiltrates. She has persistent diarrhea.  Has nausea.   Assessment & Plan:   Active Problems:   AKI (acute kidney injury) (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 Patient presented with poor oral intake, vomiting and diarrhea.  Creatinine up to 1.9 on presentation. Likely related to hypovolemia due to her acute illness. Scr improving with IV fluids which will be continued Monitor UOP Repeat labs in am.   2-COVID-19 pneumonia: Patient presented with cough, shortness of breath, chest x-ray with bilateral infiltrates. Will continue Remdesivir.  Currently she is not hypoxic, if she developed hypoxemia oxygen saturation below 94 will need to start dexamethasone. Monitor Inflammatory markers.  Repeat CXR on 1/7 with no consolidation Continue flonase and saline nasal spray for nasal congestion  3-Chronic Diastolic Heart failure; hold lasix due to dehydration.  Appears euvolemic  4-Gastroenteritis; thought to be secondary to covid. No longer having diarrhea. C diff stool test cancelled. Enteric precautions dcd.   5-Hypercalcemia: Improving. Continue with IV  fluids    DVT Prophylaxis: Lovenox Code Status: Full code Family Communication: Care discussed with patient.  Disposition Plan: Continue IP care, trend Scr due to AKI.  Inflammatory markers are improving.    Consultants:   None  Procedures:   None   Antimicrobials:   None   Subjective: She continues to feel tired but her respiratory status has improved. Her diarrhea has resolved.   Objective: Vitals:   12/19/19 2030 12/19/19 2105 12/20/19 0548 12/20/19 1246  BP: 127/69 133/71 137/87 (!) 156/85  Pulse: (!) 53 (!) 59 62 78  Resp: 16 20 18 19   Temp: 97.7 F (36.5 C) 98.6 F (37 C) 98.7 F (37.1 C)   TempSrc: Oral Oral Oral   SpO2: 95% 95% 95%   Weight:      Height:        Intake/Output Summary (Last 24 hours) at 12/20/2019 1606 Last data filed at 12/20/2019 1317 Gross per 24 hour  Intake 725.73 ml  Output 1000 ml  Net -274.27 ml   Filed Weights   12/17/19 1246  Weight: 65.8 kg    Examination:  General exam: Appears calm and comfortable. Tired appearing.  Respiratory system: No respiratory distress, no wheezing.  Cardiovascular system: S1 & S2, RRR.  Gastrointestinal system: Abdomen is nondistended, soft and nontender.  Central nervous system: Alert and oriented x3. Extremities: Symmetric 5 x 5 power. Skin: No rashes, lesions or ulcers Psychiatry: Guarded affect.     Data Reviewed: I have personally reviewed following labs and imaging studies  CBC: Recent Labs  Lab 12/17/19 1241 12/17/19 1750  WBC 4.7 4.2  NEUTROABS 3.4  --  HGB 14.7 13.8  HCT 43.6 42.7  MCV 84.7 85.6  PLT 179 170   Basic Metabolic Panel: Recent Labs  Lab 12/17/19 1241 12/17/19 1750 12/18/19 0645 12/19/19 0344 12/20/19 0500  NA 137  --  139 140 143  K 4.0  --  4.2 3.9 3.7  CL 103  --  107 107 108  CO2 22  --  19* 22 24  GLUCOSE 113*  --  92 88 98  BUN 48*  --  39* 32* 18  CREATININE 1.90* 1.64* 1.23* 1.14* 1.02*  CALCIUM 11.9*  --  11.0* 10.9* 10.6*  MG  --    --  1.7 1.5* 1.4*   GFR: Estimated Creatinine Clearance: 50.6 mL/min (A) (by C-G formula based on SCr of 1.02 mg/dL (H)). Liver Function Tests: Recent Labs  Lab 12/17/19 1241 12/18/19 0645 12/19/19 0344 12/20/19 0500  AST 21 18 16 17   ALT 15 13 14 15   ALKPHOS 73 56 50 51  BILITOT 0.9 0.5 0.2* 0.8  PROT 8.2* 6.9 6.2* 6.2*  ALBUMIN 3.5 2.8* 2.7* 2.8*   No results for input(s): LIPASE, AMYLASE in the last 168 hours. No results for input(s): AMMONIA in the last 168 hours. Coagulation Profile: No results for input(s): INR, PROTIME in the last 168 hours. Cardiac Enzymes: No results for input(s): CKTOTAL, CKMB, CKMBINDEX, TROPONINI in the last 168 hours. BNP (last 3 results) No results for input(s): PROBNP in the last 8760 hours. HbA1C: No results for input(s): HGBA1C in the last 72 hours. CBG: No results for input(s): GLUCAP in the last 168 hours. Lipid Profile: No results for input(s): CHOL, HDL, LDLCALC, TRIG, CHOLHDL, LDLDIRECT in the last 72 hours. Thyroid Function Tests: No results for input(s): TSH, T4TOTAL, FREET4, T3FREE, THYROIDAB in the last 72 hours. Anemia Panel: Recent Labs    12/19/19 0344 12/20/19 0500  FERRITIN 519* 716*   Sepsis Labs: No results for input(s): PROCALCITON, LATICACIDVEN in the last 168 hours.  No results found for this or any previous visit (from the past 240 hour(s)).       Radiology Studies: DG CHEST PORT 1 VIEW  Result Date: 12/19/2019 CLINICAL DATA:  Shortness of breath.  COVID-19 infection. EXAM: PORTABLE CHEST 1 VIEW COMPARISON:  12/17/2019; 12/12/2019 FINDINGS: Grossly unchanged cardiac silhouette and mediastinal contours with atherosclerotic plaque within the thoracic aorta. Stable position of support apparatus. Overall improved aeration of the lungs with minimal residual bibasilar opacities perihilar new focal airspace opacities. The lungs remain hyperexpanded with flat diaphragms and thinning of the biapical pulmonary parenchyma.  No acute osseous abnormalities. IMPRESSION: Overall improved aeration of the lungs with minimal residual bibasilar atelectasis. Electronically Signed   By: 02/14/2020 M.D.   On: 12/19/2019 08:08        Scheduled Meds: . vitamin C  500 mg Oral Daily  . benzonatate  100 mg Oral Q8H  . enoxaparin (LOVENOX) injection  40 mg Subcutaneous Q24H  . feeding supplement (ENSURE ENLIVE)  237 mL Oral BID BM  . fluticasone  2 spray Each Nare Daily  . zinc sulfate  220 mg Oral Daily   Continuous Infusions: . sodium chloride 1,000 mL (12/20/19 0835)  . remdesivir 100 mg in NS 100 mL Stopped (12/20/19 0607)     LOS: 2 days    Time spent: 35 minutes    02/17/20, MD [facility]   If 7PM-7AM, please contact night-coverage [email or web address] Password [facility] 12/20/2019, 4:06 PM

## 2019-12-20 NOTE — TOC Initial Note (Addendum)
Transition of Care [facility]) - Initial/Assessment Note    Patient Details  Name: Kylie Sanders MRN: [id number] Date of Birth: 1951/10/12  [facility] of [facility]) CM/SW Contact:    Kylie Parr, RN Phone Number: 12/20/2019, 10:34 AM  Clinical Narrative:     CM was unable to reach pt via phone.  CM was able to reach pts daughter.  Daughter confirms that pt stays with her.  CM read aloud OT assessment from yesterday to daughter.  Daughter informed CM that family will continue to provide 24 hour supervision and physical assist at discharge - per daughter SNF is not desired nor required.  Per daughter pt will travel home via private vehicle.  Pt recently moved from [place] to [place] - daughter confirmed address in Epic to be correct.  Daughter requests that CM make follow up appt with [facility] as pt does not yet have a PCP in the local area - agency aware that pt is covid positive.  CM setup first available appt - See AVS - facility aware pt is covid positive.  CM requested daughter google Medicare.gov HH list and TOC will follow up with [facility] agency choice prior to discharge              Expected Discharge Plan: Home w Home Health Services     Patient Goals and CMS Choice Patient states their goals for this hospitalization and ongoing recovery are:: CM was unable to reach pt via phone - CM sppke with pts daughter Kylie Sanders   Choice offered to / list presented to : Adult Children  Expected Discharge Plan and Services Expected Discharge Plan: Home w Home Health Services       Living arrangements for the past 2 months: Single Family Home                                      Prior Living Arrangements/Services Living arrangements for the past 2 months: Single Family Home Lives with:: Adult Children Patient language and need for interpreter reviewed:: Yes        Need for Family Participation in Patient Care: Yes (Comment) Care giver support system in place?: Yes  (comment) Current home services: DME(RW and wheelchair) Criminal Activity/Legal Involvement Pertinent to Current Situation/Hospitalization: No - Comment as needed  Activities of Daily Living Home Assistive Devices/Equipment: Dan Humphreys (specify type) ADL Screening (condition at time of admission) Patient's cognitive ability adequate to safely complete daily activities?: Yes Is the patient deaf or have difficulty hearing?: No Does the patient have difficulty seeing, even when wearing glasses/contacts?: No Does the patient have difficulty concentrating, remembering, or making decisions?: Yes Patient able to express need for assistance with ADLs?: Yes Does the patient have difficulty dressing or bathing?: Yes Independently performs ADLs?: Yes (appropriate for developmental age) Does the patient have difficulty walking or climbing stairs?: Yes Weakness of Legs: Both Weakness of Arms/Hands: Both  Permission Sought/Granted                  Emotional Assessment              Admission diagnosis:  Generalized weakness [R53.1] AKI (acute kidney injury) ([facility]) [N17.9] COVID-19 [U07.1] Acute kidney injury due to COVID-19 ([facility]) [U07.1, N17.9] Patient Active Problem List   Diagnosis Date Noted  . Acute kidney injury due to COVID-19 ([facility]) 12/18/2019  . AKI (acute kidney injury) ([facility]) 12/17/2019  . Pneumonia  due to COVID-19 virus 12/17/2019  . Dehydration 12/17/2019   PCP:  Patient, No Pcp Per Pharmacy:   [facility] [address], [place] [address] Phone: [phone] Fax: [phone]     Social Determinants of Health (SDOH) Interventions    Readmission Risk Interventions No flowsheet data found.

## 2019-12-20 NOTE — Care Management Important Message (Signed)
Important Message  Patient Details  Name: Kylie Sanders MRN: [id number] Date of Birth: 1951-06-24   Medicare Important Message Given:  Yes  Verbal consent obtained from admission associate Copy mailed to patient home address.    Francisca Langenderfer 12/20/2019, 11:08 AM

## 2019-12-21 DIAGNOSIS — J1282 Pneumonia due to coronavirus disease 2019: Secondary | ICD-10-CM | POA: Diagnosis not present

## 2019-12-21 DIAGNOSIS — U071 COVID-19: Secondary | ICD-10-CM | POA: Diagnosis not present

## 2019-12-21 DIAGNOSIS — N179 Acute kidney failure, unspecified: Secondary | ICD-10-CM | POA: Diagnosis not present

## 2019-12-21 DIAGNOSIS — R531 Weakness: Secondary | ICD-10-CM | POA: Diagnosis not present

## 2019-12-21 LAB — COMPREHENSIVE METABOLIC PANEL
ALT: 15 U/L (ref 0–44)
AST: 19 U/L (ref 15–41)
Albumin: 2.8 g/dL — ABNORMAL LOW (ref 3.5–5.0)
Alkaline Phosphatase: 51 U/L (ref 38–126)
Anion gap: 9 (ref 5–15)
BUN: 14 mg/dL (ref 8–23)
CO2: 23 mmol/L (ref 22–32)
Calcium: 10.2 mg/dL (ref 8.9–10.3)
Chloride: 108 mmol/L (ref 98–111)
Creatinine, Ser: 0.88 mg/dL (ref 0.44–1.00)
GFR calc Af Amer: >60 mL/min (ref >60)
GFR calc non Af Amer: >60 mL/min (ref >60)
Glucose, Bld: 91 mg/dL (ref 70–99)
Potassium: 3.8 mmol/L (ref 3.5–5.1)
Sodium: 140 mmol/L (ref 135–145)
Total Bilirubin: 0.9 mg/dL (ref 0.3–1.2)
Total Protein: 6 g/dL — ABNORMAL LOW (ref 6.5–8.1)

## 2019-12-21 LAB — FERRITIN: Ferritin: 761 ng/mL — ABNORMAL HIGH (ref 11–307)

## 2019-12-21 LAB — C-REACTIVE PROTEIN: CRP: 3 mg/dL — ABNORMAL HIGH (ref <1.0)

## 2019-12-21 LAB — D-DIMER, QUANTITATIVE: D-Dimer, Quant: 0.62 ug/mL-FEU — ABNORMAL HIGH (ref 0.00–0.50)

## 2019-12-21 LAB — MAGNESIUM: Magnesium: 1.6 mg/dL — ABNORMAL LOW (ref 1.7–2.4)

## 2019-12-21 NOTE — Progress Notes (Signed)
PROGRESS NOTE    [name]  DXI:[id number] DOB: [birth year] DOA: 12/17/2019 PCP: Patient, No Pcp Per   Brief Narrative:  [name] is a 69 y.o. female with PMH significant for COPD, HTN, OSA, MI, Pacemaker,  and chronic pain disorder who presents to the ED complaining of worsening generalized weakness, and dyspnea for 1 week.  She report nasal congestion, sore throat, cough, productive and clear sputum, body aches, chills.  She also reports emesis and diarrhea.  Patient was diagnosed with COVID-19 on 12-11-2020.  She feels so weak that she has not been able to ambulate.  She also reports mild abdominal pain, diffuse. Scr noted to elevated, patient dehydrated.  UA with rare bacteria, CXR with bl infiltrates. She has persistent diarrhea.  Has nausea.   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 Patient presented with poor oral intake, vomiting and diarrhea.  Creatinine up to 1.9 on presentation. Likely related to hypovolemia due to her acute illness. Scr improving with IV fluids which will be discontinued now that her Scr is stabilized.  Monitor UOP Repeat labs in am.   2-COVID-19 pneumonia: Patient presented with cough, shortness of breath, chest x-ray with bilateral infiltrates. Will continue Remdesivir.  Currently she is not hypoxic, if she developed hypoxemia oxygen saturation below 94 will need to start dexamethasone. Monitor Inflammatory markers.  Repeat CXR on 1/7 with no consolidation Continue flonase and saline nasal spray for nasal congestion  3-Chronic Diastolic Heart failure; hold lasix due to dehydration.  Appears euvolemic  4-Gastroenteritis; thought to be secondary to covid. No longer having diarrhea. C diff stool test cancelled. Enteric precautions dcd.    5-Hypercalcemia: Improved with IV fluids.      DVT Prophylaxis: Lovenox Code Status: Full code Family Communication: Care discussed with patient.  Disposition Plan: Continue IP care, trend Scr due to AKI.  Inflammatory markers are improving.    Consultants:   None  Procedures:   None   Antimicrobials:   None   Subjective: No longer having diarrhea. Appetite is slightly improved. She feels tired.   Objective: Vitals:   12/20/19 1246 12/21/19 0524 12/21/19 1639 12/21/19 1640  BP: (!) 156/85 (!) 144/76 (!) 151/79   Pulse: 78 68    Resp: 19 18  14   Temp: 98.7 F (37.1 C) 98.4 F (36.9 C)  98.5 F (36.9 C)  TempSrc: Oral Oral  Oral  SpO2: 99% 94%  97%  Weight:      Height:        Intake/Output Summary (Last 24 hours) at 12/21/2019 1902 Last data filed at 12/21/2019 1850 Gross per 24 hour  Intake 120 ml  Output 1100 ml  Net -980 ml   Filed Weights   12/17/19 1246  Weight: 65.8 kg    Examination:  General exam: Appears calm and comfortable. Tired appearing.  Respiratory system: No respiratory distress, no wheezing.  Cardiovascular system: S1 & S2, RRR.  Gastrointestinal system: Abdomen is nondistended, soft and nontender.  Central nervous system: Alert and oriented x3. Extremities: Symmetric 5 x 5 power. Skin: No rashes, lesions or ulcers Psychiatry: Guarded affect.     Data Reviewed: I have personally reviewed following labs and imaging studies  CBC: Recent Labs  Lab 12/17/19 1241 12/17/19 1750  WBC 4.7 4.2  NEUTROABS  3.4  --   HGB 14.7 13.8  HCT 43.6 42.7  MCV 84.7 85.6  PLT 179 458   Basic Metabolic Panel: Recent Labs  Lab 12/17/19 1241 12/17/19 1750 12/18/19 0645 12/19/19 0344 12/20/19 0500 12/21/19 0802  NA 137  --  139 140 143 140  K 4.0  --  4.2 3.9 3.7 3.8  CL 103  --  107 107 108 108  CO2 22  --  19* 22 24 23   GLUCOSE 113*  --  92 88 98 91  BUN 48*  --  39* 32* 18 14  CREATININE 1.90* 1.64* 1.23* 1.14* 1.02* 0.88   CALCIUM 11.9*  --  11.0* 10.9* 10.6* 10.2  MG  --   --  1.7 1.5* 1.4* 1.6*   GFR: Estimated Creatinine Clearance: 58.7 mL/min (by C-G formula based on SCr of 0.88 mg/dL). Liver Function Tests: Recent Labs  Lab 12/17/19 1241 12/18/19 0645 12/19/19 0344 12/20/19 0500 12/21/19 0802  AST 21 18 16 17 19   ALT 15 13 14 15 15   ALKPHOS 73 56 50 51 51  BILITOT 0.9 0.5 0.2* 0.8 0.9  PROT 8.2* 6.9 6.2* 6.2* 6.0*  ALBUMIN 3.5 2.8* 2.7* 2.8* 2.8*   No results for input(s): LIPASE, AMYLASE in the last 168 hours. No results for input(s): AMMONIA in the last 168 hours. Coagulation Profile: No results for input(s): INR, PROTIME in the last 168 hours. Cardiac Enzymes: No results for input(s): CKTOTAL, CKMB, CKMBINDEX, TROPONINI in the last 168 hours. BNP (last 3 results) No results for input(s): PROBNP in the last 8760 hours. HbA1C: No results for input(s): HGBA1C in the last 72 hours. CBG: No results for input(s): GLUCAP in the last 168 hours. Lipid Profile: No results for input(s): CHOL, HDL, LDLCALC, TRIG, CHOLHDL, LDLDIRECT in the last 72 hours. Thyroid Function Tests: No results for input(s): TSH, T4TOTAL, FREET4, T3FREE, THYROIDAB in the last 72 hours. Anemia Panel: Recent Labs    12/20/19 0500 12/21/19 0802  FERRITIN 716* 761*   Sepsis Labs: No results for input(s): PROCALCITON, LATICACIDVEN in the last 168 hours.  No results found for this or any previous visit (from the past 240 hour(s)).       Radiology Studies: No results found.      Scheduled Meds: . vitamin C  500 mg Oral Daily  . benzonatate  100 mg Oral Q8H  . enoxaparin (LOVENOX) injection  40 mg Subcutaneous Q24H  . feeding supplement (ENSURE ENLIVE)  237 mL Oral BID BM  . fluticasone  2 spray Each Nare Daily  . zinc sulfate  220 mg Oral Daily   Continuous Infusions: . sodium chloride 75 mL/hr at 12/20/19 2100  . remdesivir 100 mg in NS 100 mL 100 mg (12/20/19 2100)     LOS: 3 days    Time  spent: 35 minutes    Blain Pais, MD [facility]   If 7PM-7AM, please contact night-coverage [email or web address] Password [email or web address] 12/21/2019, 7:02 PM

## 2019-12-22 DIAGNOSIS — E860 Dehydration: Secondary | ICD-10-CM | POA: Diagnosis not present

## 2019-12-22 DIAGNOSIS — R531 Weakness: Secondary | ICD-10-CM | POA: Diagnosis not present

## 2019-12-22 DIAGNOSIS — J1282 Pneumonia due to coronavirus disease 2019: Secondary | ICD-10-CM | POA: Diagnosis not present

## 2019-12-22 DIAGNOSIS — N179 Acute kidney failure, unspecified: Secondary | ICD-10-CM | POA: Diagnosis not present

## 2019-12-22 DIAGNOSIS — U071 COVID-19: Secondary | ICD-10-CM | POA: Diagnosis not present

## 2019-12-22 LAB — COMPREHENSIVE METABOLIC PANEL
ALT: 17 U/L (ref 0–44)
AST: 17 U/L (ref 15–41)
Albumin: 2.6 g/dL — ABNORMAL LOW (ref 3.5–5.0)
Alkaline Phosphatase: 54 U/L (ref 38–126)
Anion gap: 9 (ref 5–15)
BUN: 10 mg/dL (ref 8–23)
CO2: 19 mmol/L — ABNORMAL LOW (ref 22–32)
Calcium: 10.1 mg/dL (ref 8.9–10.3)
Chloride: 107 mmol/L (ref 98–111)
Creatinine, Ser: 0.89 mg/dL (ref 0.44–1.00)
GFR calc Af Amer: >60 mL/min (ref >60)
GFR calc non Af Amer: >60 mL/min (ref >60)
Glucose, Bld: 91 mg/dL (ref 70–99)
Potassium: 3.4 mmol/L — ABNORMAL LOW (ref 3.5–5.1)
Sodium: 135 mmol/L (ref 135–145)
Total Bilirubin: 1.4 mg/dL — ABNORMAL HIGH (ref 0.3–1.2)
Total Protein: 6 g/dL — ABNORMAL LOW (ref 6.5–8.1)

## 2019-12-22 LAB — FERRITIN: Ferritin: 670 ng/mL — ABNORMAL HIGH (ref 11–307)

## 2019-12-22 LAB — D-DIMER, QUANTITATIVE: D-Dimer, Quant: 0.48 ug/mL-FEU (ref 0.00–0.50)

## 2019-12-22 LAB — MAGNESIUM: Magnesium: 1.3 mg/dL — ABNORMAL LOW (ref 1.7–2.4)

## 2019-12-22 LAB — C-REACTIVE PROTEIN: CRP: 2.1 mg/dL — ABNORMAL HIGH (ref <1.0)

## 2019-12-22 MED ORDER — POTASSIUM CHLORIDE CRYS ER 20 MEQ PO TBCR
40.0000 meq | EXTENDED_RELEASE_TABLET | Freq: Once | ORAL | Status: AC
  Administered 2019-12-22: 40 meq via ORAL
  Filled 2019-12-22: qty 2

## 2019-12-22 MED ORDER — ENSURE ENLIVE PO LIQD: 237.0000 mL | Freq: Two times a day (BID) | ORAL | 12 refills | Status: DC

## 2019-12-22 MED ORDER — ALBUTEROL SULFATE HFA 108 (90 BASE) MCG/ACT IN AERS: 2.0000 | INHALATION_SPRAY | RESPIRATORY_TRACT | 0 refills | Status: DC | PRN

## 2019-12-22 MED ORDER — FLUTICASONE PROPIONATE 50 MCG/ACT NA SUSP: 2.0000 | Freq: Every day | NASAL | 0 refills | Status: DC

## 2019-12-22 MED ORDER — ONDANSETRON HCL 4 MG PO TABS: 4.0000 mg | ORAL_TABLET | Freq: Four times a day (QID) | ORAL | 0 refills | Status: DC | PRN

## 2019-12-22 MED ORDER — SALINE SPRAY 0.65 % NA SOLN: 1.0000 | NASAL | 0 refills | Status: DC | PRN

## 2019-12-22 MED ORDER — MAGNESIUM SULFATE 2 GM/50ML IV SOLN
2.0000 g | Freq: Once | INTRAVENOUS | Status: AC
  Administered 2019-12-22: 2 g via INTRAVENOUS
  Filled 2019-12-22: qty 50

## 2019-12-22 NOTE — TOC Transition Note (Signed)
Transition of Care [facility]) - CM/SW Discharge Note   Patient Details  Name: Kylie Sanders MRN: [id number] Date of Birth: 03/08/1951  Transition [facility]) CM/SW Contact:  Deveron Furlong, RN 12/22/2019, 4:05 PM   Clinical Narrative:    Patient to dc home with daughter.  Daughter, Tania Ade, previously wanted to check [facility] agencies online.  Tania Ade agrees to [employer] Referral accepted by Barbara Cower at [facility]    Elisha requests a RW, 3n1, and shower stool for patient.  DME ordered from [employer] to be delivered to room prior to d/c.    Final next level of care: Home w Home Health Services Barriers to Discharge: No Barriers Identified   Patient Goals and CMS Choice Patient states their goals for this hospitalization and ongoing recovery are:: CM was unable to reach pt via phone - CM sppke with pts daughter [facility].gov Compare Post Acute Care list provided to:: Patient Represenative (must comment)(daughter) Choice offered to / list presented to : Adult Children   Discharge Plan and Services                DME Arranged: 3-N-1, Walker rolling, Shower stool DME Agency: [facility] Date DME Agency Contacted: 12/22/19 Time DME Agency Contacted: [phone] Representative spoke with at DME Agency: [facility] HH Arranged: PT, OT HH Agency: [facility] Date HH Agency Contacted: 12/22/19 Time HH Agency Contacted: 1605 Representative spoke with at [facility] Agency: Barbara Cower

## 2019-12-22 NOTE — Discharge Summary (Signed)
Physician Discharge Summary  Kylie Sanders KKX:[id number] DOB: 09-Jun-1951 DOA: 12/17/2019  PCP: Patient, No Pcp Per  Admit date: 12/17/2019 Discharge date: 12/22/2019  Admitted From: Home Disposition: Home  Recommendations for Outpatient Follow-up:  1. Follow up with PCP in 1-2 weeks 2. Please obtain CMP/CBC in one week   Home Health:Yes, HH PT and OT Equipment/Devices: Bedside commode Discharge Condition:Stable CODE STATUS: Full Diet recommendation: Heart Healthy  Brief/Interim Summary: Kylie Sanders a 69 y.o.femalewith PMH significant for COPD, HTN, OSA, MI, Pacemaker,and chronic pain disorder who presents to the ED complaining of worsening generalized weakness, and dyspnea for 1 week. She report nasal congestion, sore throat, cough, productive and clear sputum, body aches, chills. She also reports emesis and diarrhea. Patient was diagnosed with COVID-19 on 12-11-2020.She feels so weak that she has not been able to ambulate.She also reports mild abdominal pain, diffuse. Scr noted to elevated, patient dehydrated.  UA with rare bacteria, CXR with bl infiltrates. She has persistent diarrhea.  Has nausea.   Discharge Diagnoses:  Active Problems:   AKI (acute kidney injury) (HCC)   Pneumonia due to COVID-19 virus   Dehydration   Acute kidney injury due to COVID-19 (HCC)  1-AKI on chronic kidney disease stage JI:[id number] baseline 1.0. Patient presented with poor oral intake, vomiting and diarrhea. Creatinine up to 1.9 on presentation. Likely related to hypovolemia due to her acute illness. Scr improving with IV fluids which were discontinued now that her Scr is stabilized.  UOP has been adequate.  She was tolerating po intake well prior to discharge.   2-COVID-19 pneumonia: Patient presented with cough, shortness of breath, chest x-ray with bilateral infiltrates. She completed treatment withRemdesivir. She was not hypoxic and was not started on  dexamethasone. Inflammatory markers improved prior to discharge.  Repeat CXR on 1/7 with no consolidation Continue flonase and saline nasal spray for nasal congestion.  3-Chronic Diastolic Heart failure; Appears euvolemic  4-Gastroenteritis; thought to be secondary to covid. No longer having diarrhea. C diff stool test cancelled. Enteric precautions dcd. She had no diarrhea prior to discharge.   5-Hypercalcemia: Improved with IV fluids  6-Physical debility:  Patient will have HH PT and OT. Ordered DME, bedside commode. Discussed with her daughter on the phone who is a CNA and will be taking care of her mother at home.   Discharge Instructions  Discharge Instructions    DME Bedside commode   Complete by: As directed    Patient needs a bedside commode to treat with the following condition: Weakness   Diet - low sodium heart healthy   Complete by: As directed    Face-to-face encounter (required for Medicare/Medicaid patients)   Complete by: As directed    I Ky Barban certify that this patient is under my care and that I, or a nurse practitioner or physician's assistant working with me, had a face-to-face encounter that meets the physician face-to-face encounter requirements with this patient on 12/22/2019. The encounter with the patient was in whole, or in part for the following medical condition(s) which is the primary reason for home health care (List medical condition): Physical debility, COVID19 pneumonia   The encounter with the patient was in whole, or in part, for the following medical condition, which is the primary reason for home health care: physical debility, COVID19 pneumonia   I certify that, based on my findings, the following services are medically necessary home health services: Physical therapy   Reason for Medically Necessary Home Health Services:  Therapy- Gait  Training, Patent examiner Therapy- Instruction on use of Assistive Device for  Ambulation on all Surfaces     My clinical findings support the need for the above services: Shortness of breath with activity   Further, I certify that my clinical findings support that this patient is homebound due to: Unsafe ambulation due to balance issues   Home Health   Complete by: As directed    To provide the following care/treatments:  PT OT     Increase activity slowly   Complete by: As directed    MyChart COVID-19 home monitoring program   Complete by: Dec 22, 2019    Is the patient willing to use the MyChart Mobile App for home monitoring?: Yes     Allergies as of 12/22/2019   No Known Allergies     Medication List    STOP taking these medications   benzonatate 100 MG capsule Commonly known as: TESSALON   ibuprofen 600 MG tablet Commonly known as: ADVIL     TAKE these medications   albuterol 108 (90 Base) MCG/ACT inhaler Commonly known as: VENTOLIN HFA Inhale 2 puffs into the lungs every 4 (four) hours as needed for wheezing or shortness of breath.   feeding supplement (ENSURE ENLIVE) Liqd Take 237 mLs by mouth 2 (two) times daily between meals. Start taking on: December 23, 2019   fluticasone 50 MCG/ACT nasal spray Commonly known as: FLONASE Place 2 sprays into both nostrils daily. Start taking on: December 23, 2019   losartan-hydrochlorothiazide 50-12.5 MG tablet Commonly known as: HYZAAR Take 1 tablet by mouth daily.   lubiprostone 24 MCG capsule Commonly known as: AMITIZA Take 24 mcg by mouth 2 (two) times daily with a meal.   omeprazole 20 MG capsule Commonly known as: PRILOSEC Take 20 mg by mouth daily.   ondansetron 4 MG tablet Commonly known as: ZOFRAN Take 1 tablet (4 mg total) by mouth every 6 (six) hours as needed for nausea.   potassium chloride 10 MEQ tablet Commonly known as: KLOR-CON Take 10 mEq by mouth 2 (two) times daily.   sodium chloride 0.65 % Soln nasal spray Commonly known as: OCEAN Place 1 spray into both nostrils as  needed for congestion.   tiZANidine 4 MG tablet Commonly known as: Zanaflex Take 1-2 tablets (4-8 mg total) by mouth every 6 (six) hours as needed for muscle spasms.            Durable Medical Equipment  (From admission, onward)         Start     Ordered   12/22/19 0000  DME Bedside commode    Question:  Patient needs a bedside commode to treat with the following condition  Answer:  Weakness   12/22/19 1454          No Known Allergies  Consultations:  None   Procedures/Studies: DG Chest 2 View  Result Date: 12/12/2019 CLINICAL DATA:  Pt states she has been experiencing neck pain, leg pain, and back pain with difficulty breathing x Monday. Pt is not a smoker. Pt has hx of hypertension and diabetes. Denies previous chest/lung injury. Pt does have a pacemaker. SRP EXAM: CHEST - 2 VIEW COMPARISON:  None. FINDINGS: Cardiac silhouette is normal in size and configuration. Left anterior chest wall sequential pacemaker is well positioned, leads projecting in the right atrium and right ventricle. No mediastinal or hilar masses or evidence of adenopathy. The pulmonary arteries are prominent. Lungs are mildly hyperexpanded, but clear. No  pleural effusion or pneumothorax. Skeletal structures are intact. IMPRESSION: No active cardiopulmonary disease. Electronically Signed   By: Lajean Manes M.D.   On: 12/12/2019 13:16   DG CHEST PORT 1 VIEW  Result Date: 12/19/2019 CLINICAL DATA:  Shortness of breath.  COVID-19 infection. EXAM: PORTABLE CHEST 1 VIEW COMPARISON:  12/17/2019; 12/12/2019 FINDINGS: Grossly unchanged cardiac silhouette and mediastinal contours with atherosclerotic plaque within the thoracic aorta. Stable position of support apparatus. Overall improved aeration of the lungs with minimal residual bibasilar opacities perihilar new focal airspace opacities. The lungs remain hyperexpanded with flat diaphragms and thinning of the biapical pulmonary parenchyma. No acute osseous  abnormalities. IMPRESSION: Overall improved aeration of the lungs with minimal residual bibasilar atelectasis. Electronically Signed   By: Sandi Mariscal M.D.   On: 12/19/2019 08:08   DG Chest Portable 1 View  Result Date: 12/17/2019 CLINICAL DATA:  Shortness of breath.  Cough. EXAM: PORTABLE CHEST 1 VIEW COMPARISON:  12/12/2019. FINDINGS: Cardiac pacer with lead tip over the right atrium right ventricle. Heart size stable. No pulmonary venous congestion. Bibasilar pulmonary infiltrates, right side greater than left. No pleural effusion or pneumothorax. IMPRESSION: 1. Bibasilar pulmonary infiltrates, right side greater than left. Findings most consistent with pneumonia. Bibasilar pulmonary edema could also present in this fashion. 2. Cardiac pacer in stable position. Heart size normal. No pulmonary venous congestion. Electronically Signed   By: Marcello Moores  Register   On: 12/17/2019 13:18      Subjective: Patient had nausea with small amount of non bilious emesis this morning. She had lunch with no nausea or emesis. She is eager to go home. Her O2 sats remain stable on RA. She denies diarrhea. She is weak but overall she feels better.   Discharge Exam: Vitals:   12/22/19 1231 12/22/19 1256  BP: (!) 162/103 (!) 154/81  Pulse:    Resp: 20   Temp: 98.9 F (37.2 C)   SpO2:     Vitals:   12/22/19 0515 12/22/19 0803 12/22/19 1231 12/22/19 1256  BP: (!) 169/95 (!) 156/90 (!) 162/103 (!) 154/81  Pulse: 62     Resp: (!) 25  20   Temp: 98.7 F (37.1 C)  98.9 F (37.2 C)   TempSrc: Oral  Oral   SpO2: 97%     Weight:      Height:        General: Pt is alert, awake, not in acute distress Cardiovascular: RRR, S1/S2 Respiratory: no respiratory distress, no wheezing Abdominal: Soft, NT, ND Extremities: no edema, no cyanosis    The results of significant diagnostics from this hospitalization (including imaging, microbiology, ancillary and laboratory) are listed below for reference.      Microbiology: No results found for this or any previous visit (from the past 240 hour(s)).   Labs: BNP (last 3 results) No results for input(s): BNP in the last 8760 hours. Basic Metabolic Panel: Recent Labs  Lab 12/18/19 0645 12/19/19 0344 12/20/19 0500 12/21/19 0802 12/22/19 0401  NA 139 140 143 140 135  K 4.2 3.9 3.7 3.8 3.4*  CL 107 107 108 108 107  CO2 19* 22 24 23  19*  GLUCOSE 92 88 98 91 91  BUN 39* 32* 18 14 10   CREATININE 1.23* 1.14* 1.02* 0.88 0.89  CALCIUM 11.0* 10.9* 10.6* 10.2 10.1  MG 1.7 1.5* 1.4* 1.6* 1.3*   Liver Function Tests: Recent Labs  Lab 12/18/19 0645 12/19/19 0344 12/20/19 0500 12/21/19 0802 12/22/19 0401  AST 18 16 17 19  17  ALT 13 14 15 15 17   ALKPHOS 56 50 51 51 54  BILITOT 0.5 0.2* 0.8 0.9 1.4*  PROT 6.9 6.2* 6.2* 6.0* 6.0*  ALBUMIN 2.8* 2.7* 2.8* 2.8* 2.6*   No results for input(s): LIPASE, AMYLASE in the last 168 hours. No results for input(s): AMMONIA in the last 168 hours. CBC: Recent Labs  Lab 12/17/19 1241 12/17/19 1750  WBC 4.7 4.2  NEUTROABS 3.4  --   HGB 14.7 13.8  HCT 43.6 42.7  MCV 84.7 85.6  PLT 179 170   Cardiac Enzymes: No results for input(s): CKTOTAL, CKMB, CKMBINDEX, TROPONINI in the last 168 hours. BNP: Invalid input(s): POCBNP CBG: No results for input(s): GLUCAP in the last 168 hours. D-Dimer Recent Labs    12/21/19 0802 12/22/19 0401  DDIMER 0.62* 0.48   Hgb A1c No results for input(s): HGBA1C in the last 72 hours. Lipid Profile No results for input(s): CHOL, HDL, LDLCALC, TRIG, CHOLHDL, LDLDIRECT in the last 72 hours. Thyroid function studies No results for input(s): TSH, T4TOTAL, T3FREE, THYROIDAB in the last 72 hours.  Invalid input(s): FREET3 Anemia work up Recent Labs    12/21/19 0802 12/22/19 0401  FERRITIN 761* 670*   Urinalysis    Component Value Date/Time   COLORURINE YELLOW 12/17/2019 1600   APPEARANCEUR CLEAR 12/17/2019 1600   LABSPEC 1.016 12/17/2019 1600    PHURINE 5.0 12/17/2019 1600   GLUCOSEU NEGATIVE 12/17/2019 1600   HGBUR NEGATIVE 12/17/2019 1600   BILIRUBINUR NEGATIVE 12/17/2019 1600   KETONESUR 5 (A) 12/17/2019 1600   PROTEINUR 100 (A) 12/17/2019 1600   NITRITE NEGATIVE 12/17/2019 1600   LEUKOCYTESUR NEGATIVE 12/17/2019 1600   Sepsis Labs Invalid input(s): PROCALCITONIN,  WBC,  LACTICIDVEN Microbiology No results found for this or any previous visit (from the past 240 hour(s)).   Time coordinating discharge: Over 33 minutes  SIGNED:   02/14/2020, MD  [facility] 12/22/2019, 2:54 PM    If 7PM-7AM, please contact night-coverage [email or web address] Password [email or web address]

## 2019-12-22 NOTE — Progress Notes (Addendum)
Kylie Sanders to be D/C'd Home per MD order.  Discussed with the patient and all questions fully answered.  VSS, Skin clean, dry and intact without evidence of skin break down, no evidence of skin tears noted. IV catheter discontinued intact. Site without signs and symptoms of complications. Dressing and pressure applied.  An After Visit Summary was printed and given to the patient. D/c education completed with patient/family including follow up instructions, medication list, d/c activities limitations if indicated, with other d/c instructions as indicated by MD - patient able to verbalize understanding, all questions fully answered.    Patient instructed to return to ED, call 911, or call MD for any changes in condition.   Patient escorted via WC, and D/C home via private auto. Patient sent with belongings, 3in1, RW and commode.  Quincy Carnes 12/22/2019 4:28 PM

## 2020-01-13 DIAGNOSIS — E559 Vitamin D deficiency, unspecified: Secondary | ICD-10-CM

## 2020-01-13 HISTORY — DX: Vitamin D deficiency, unspecified: E55.9

## 2020-01-17 ENCOUNTER — Encounter: Payer: Self-pay | Admitting: Family Medicine

## 2020-01-17 ENCOUNTER — Ambulatory Visit (INDEPENDENT_AMBULATORY_CARE_PROVIDER_SITE_OTHER): Payer: Medicare Other | Admitting: Family Medicine

## 2020-01-17 ENCOUNTER — Other Ambulatory Visit: Payer: Self-pay

## 2020-01-17 DIAGNOSIS — Z538 Procedure and treatment not carried out for other reasons: Secondary | ICD-10-CM

## 2020-01-29 ENCOUNTER — Ambulatory Visit (INDEPENDENT_AMBULATORY_CARE_PROVIDER_SITE_OTHER): Payer: Medicare Other | Admitting: Family Medicine

## 2020-01-29 ENCOUNTER — Other Ambulatory Visit: Payer: Self-pay

## 2020-01-29 ENCOUNTER — Other Ambulatory Visit: Payer: Self-pay | Admitting: Family Medicine

## 2020-01-29 ENCOUNTER — Encounter: Payer: Self-pay | Admitting: Family Medicine

## 2020-01-29 VITALS — BP 120/65 | HR 61 | Temp 98.7°F | Ht 64.0 in | Wt 169.2 lb

## 2020-01-29 DIAGNOSIS — R05 Cough: Secondary | ICD-10-CM

## 2020-01-29 DIAGNOSIS — Z7689 Persons encountering health services in other specified circumstances: Secondary | ICD-10-CM

## 2020-01-29 DIAGNOSIS — Z87891 Personal history of nicotine dependence: Secondary | ICD-10-CM

## 2020-01-29 DIAGNOSIS — Z0000 Encounter for general adult medical examination without abnormal findings: Secondary | ICD-10-CM

## 2020-01-29 DIAGNOSIS — Z09 Encounter for follow-up examination after completed treatment for conditions other than malignant neoplasm: Secondary | ICD-10-CM | POA: Diagnosis not present

## 2020-01-29 DIAGNOSIS — R059 Cough, unspecified: Secondary | ICD-10-CM

## 2020-01-29 DIAGNOSIS — E538 Deficiency of other specified B group vitamins: Secondary | ICD-10-CM

## 2020-01-29 DIAGNOSIS — Z8616 Personal history of COVID-19: Secondary | ICD-10-CM

## 2020-01-29 DIAGNOSIS — D72819 Decreased white blood cell count, unspecified: Secondary | ICD-10-CM

## 2020-01-29 DIAGNOSIS — I509 Heart failure, unspecified: Secondary | ICD-10-CM | POA: Diagnosis not present

## 2020-01-29 DIAGNOSIS — R0989 Other specified symptoms and signs involving the circulatory and respiratory systems: Secondary | ICD-10-CM

## 2020-01-29 DIAGNOSIS — R0602 Shortness of breath: Secondary | ICD-10-CM

## 2020-01-29 DIAGNOSIS — J45909 Unspecified asthma, uncomplicated: Secondary | ICD-10-CM

## 2020-01-29 DIAGNOSIS — E559 Vitamin D deficiency, unspecified: Secondary | ICD-10-CM

## 2020-01-29 LAB — POCT URINALYSIS DIPSTICK
Bilirubin, UA: NEGATIVE
Blood, UA: NEGATIVE
Glucose, UA: NEGATIVE
Ketones, UA: NEGATIVE
Leukocytes, UA: NEGATIVE
Nitrite, UA: NEGATIVE
Protein, UA: NEGATIVE
Spec Grav, UA: >=1.03 — AB (ref 1.010–1.025)
Urobilinogen, UA: 0.2 E.U./dL
pH, UA: 5.5 (ref 5.0–8.0)

## 2020-01-29 MED ORDER — LOSARTAN POTASSIUM-HCTZ 50-12.5 MG PO TABS: 1.0000 | ORAL_TABLET | Freq: Every day | ORAL | 6 refills | Status: DC

## 2020-01-29 MED ORDER — ALBUTEROL SULFATE (2.5 MG/3ML) 0.083% IN NEBU: 2.5000 mg | INHALATION_SOLUTION | Freq: Four times a day (QID) | RESPIRATORY_TRACT | 12 refills | Status: DC | PRN

## 2020-01-29 MED ORDER — ENSURE ENLIVE PO LIQD: 237.0000 mL | Freq: Two times a day (BID) | ORAL | 12 refills | Status: AC

## 2020-01-29 MED ORDER — POTASSIUM CHLORIDE CRYS ER 10 MEQ PO TBCR: 10.0000 meq | EXTENDED_RELEASE_TABLET | Freq: Two times a day (BID) | ORAL | 3 refills | Status: DC

## 2020-01-29 MED ORDER — ALBUTEROL SULFATE HFA 108 (90 BASE) MCG/ACT IN AERS: 2.0000 | INHALATION_SPRAY | RESPIRATORY_TRACT | 12 refills | Status: DC | PRN

## 2020-01-29 MED ORDER — AMOXICILLIN-POT CLAVULANATE 875-125 MG PO TABS: 1.0000 | ORAL_TABLET | Freq: Two times a day (BID) | ORAL | 0 refills | Status: AC

## 2020-01-29 MED ORDER — OMEPRAZOLE 20 MG PO CPDR: 20.0000 mg | DELAYED_RELEASE_CAPSULE | Freq: Every day | ORAL | 6 refills | Status: DC

## 2020-01-29 MED ORDER — TIZANIDINE HCL 4 MG PO TABS: 4.0000 mg | ORAL_TABLET | Freq: Four times a day (QID) | ORAL | 3 refills | Status: DC | PRN

## 2020-01-29 MED ORDER — LUBIPROSTONE 24 MCG PO CAPS: 24.0000 ug | ORAL_CAPSULE | Freq: Two times a day (BID) | ORAL | 6 refills | Status: DC

## 2020-01-29 MED ORDER — HYDROCODONE-HOMATROPINE 5-1.5 MG/5ML PO SYRP: 5.0000 mL | ORAL_SOLUTION | Freq: Three times a day (TID) | ORAL | 0 refills | Status: DC | PRN

## 2020-01-29 MED ORDER — NEBULIZER MISC: 1.0000 | Freq: Four times a day (QID) | 0 refills | Status: AC | PRN

## 2020-01-29 MED ORDER — FLUTICASONE PROPIONATE 50 MCG/ACT NA SUSP: 2.0000 | Freq: Every day | NASAL | 0 refills | Status: DC

## 2020-01-29 MED ORDER — SALINE SPRAY 0.65 % NA SOLN: 1.0000 | NASAL | 6 refills | Status: DC | PRN

## 2020-01-29 NOTE — Progress Notes (Signed)
[facility]   New Patient--Hospital Follow Up--Establish Care  Subjective:  Patient ID: Kylie Sanders, female    DOB: 1951/06/07  Age: 69 y.o. MRN: [id number]  CC:  Chief Complaint  Patient presents with  . New Patient (Initial Visit)  . Back Pain    Lower back pain    HPI Kylie Sanders is a 69 year old female who presents for Hospital Follow Up and to Establish Care today.   Past Medical History:  Diagnosis Date  . Asthma   . CHF (congestive heart failure) (HCC)   . Diabetes mellitus without complication (HCC)   . Hyperlipidemia   . Hypertension   . Hypokalemia 12/2019  . Myocardial infarction (HCC)   . Seizures (HCC)   . Stroke [facility])    Current Status: This will be Kylie Sanders's initial office visit with me. She was previously seeing a Physician years ago in [place], [place], which she recently relocated from 3 months ago. She has been lost to follow up for her PCP needs. Since her last office visit, she has been diagnosed with Coronavirus Infection on 12/17/2019-12/22/2019. She is doing well with no complaints. She states that her appetite has been minimal since diagnosed with Coronavirus. She has been taking Ensure Supplements as needed. She eats breakfast, eats partial lunch, and most of her meal at dinner. She has a frequent cough. She denies visual changes, chest pain, shortness of breath, heart palpitations, and falls. She has occasional headaches and dizziness with position changes. Denies severe headaches, confusion, seizures, double vision, and blurred vision, nausea and vomiting. She has not been monitoring her blood glucose levels regularly lately. She denies fatigue, frequent urination, blurred vision, excessive hunger, excessive thirst, weight gain, weight loss, and poor wound healing. She continues to check her feet regularly. She denies fevers, chills, fatigue, recent infections, weight loss, and night sweats. No reports of  GI problems such as diarrhea, and constipation. Sh has no reports of blood in stools, dysuria and hematuria. No depression or anxiety, and denies suicidal ideations, homicidal ideations, or auditory hallucinations. She has chronic back pain.   Past Surgical History:  Procedure Laterality Date  . PACEMAKER IMPLANT      No family history on file.  Social History   Socioeconomic History  . Marital status: Single    Spouse name: Not on file  . Number of children: Not on file  . Years of education: Not on file  . Highest education level: Not on file  Occupational History  . Not on file  Tobacco Use  . Smoking status: Former Games developer  . Smokeless tobacco: Never Used  Substance and Sexual Activity  . Alcohol use: Not Currently  . Drug use: Never  . Sexual activity: Not Currently  Other Topics Concern  . Not on file  Social History Narrative  . Not on file   Social Determinants of Health   Financial Resource Strain:   . Difficulty of Paying Living Expenses: Not on file  Food Insecurity:   . Worried About Programme researcher, broadcasting/film/video in the Last Year: Not on file  . Ran Out of Food in the Last Year: Not on file  Transportation Needs:   . Lack of Transportation (Medical): Not on file  . Lack of Transportation (Non-Medical): Not on file  Physical Activity:   . Days of Exercise per Week: Not on file  . Minutes of Exercise per Session: Not on file  Stress:   .  Feeling of Stress : Not on file  Social Connections:   . Frequency of Communication with Friends and Family: Not on file  . Frequency of Social Gatherings with Friends and Family: Not on file  . Attends Religious Services: Not on file  . Active Member of Clubs or Organizations: Not on file  . Attends BankerClub or Organization Meetings: Not on file  . Marital Status: Not on file  Intimate Partner Violence:   . Fear of Current or Ex-Partner: Not on file  . Emotionally Abused: Not on file  . Physically Abused: Not on file  . Sexually  Abused: Not on file    Outpatient Medications Prior to Visit  Medication Sig Dispense Refill  . ondansetron (ZOFRAN) 4 MG tablet Take 1 tablet (4 mg total) by mouth every 6 (six) hours as needed for nausea. 20 tablet 0  . albuterol (VENTOLIN HFA) 108 (90 Base) MCG/ACT inhaler Inhale 2 puffs into the lungs every 4 (four) hours as needed for wheezing or shortness of breath. 18 g 0  . feeding supplement, ENSURE ENLIVE, (ENSURE ENLIVE) LIQD Take 237 mLs by mouth 2 (two) times daily between meals. 237 mL 12  . fluticasone (FLONASE) 50 MCG/ACT nasal spray Place 2 sprays into both nostrils daily. 16 g 0  . losartan-hydrochlorothiazide (HYZAAR) 50-12.5 MG tablet Take 1 tablet by mouth daily.    Marland Kitchen. lubiprostone (AMITIZA) 24 MCG capsule Take 24 mcg by mouth 2 (two) times daily with a meal.    . omeprazole (PRILOSEC) 20 MG capsule Take 20 mg by mouth daily.    . potassium chloride (KLOR-CON) 10 MEQ tablet Take 10 mEq by mouth 2 (two) times daily.    . sodium chloride (OCEAN) 0.65 % SOLN nasal spray Place 1 spray into both nostrils as needed for congestion. 30 mL 0  . tiZANidine (ZANAFLEX) 4 MG tablet Take 1-2 tablets (4-8 mg total) by mouth every 6 (six) hours as needed for muscle spasms. 21 tablet 0   No facility-administered medications prior to visit.    No Known Allergies  ROS Review of Systems  Constitutional: Negative.   HENT: Negative.   Eyes: Negative.   Respiratory: Positive for cough (frequent).        Chest congestion  Cardiovascular: Negative.   Gastrointestinal: Negative.   Endocrine: Negative.   Genitourinary: Negative.   Musculoskeletal: Positive for arthralgias (generalized) and back pain (chronic).  Skin: Negative.   Allergic/Immunologic: Negative.   Neurological: Negative.   Hematological: Negative.   Psychiatric/Behavioral: Negative.       Objective:    Physical Exam  Constitutional: She is oriented to person, place, and time. She appears well-developed and  well-nourished.  HENT:  Head: Normocephalic and atraumatic.  Eyes: Conjunctivae are normal.  Cardiovascular: Normal rate, regular rhythm, normal heart sounds and intact distal pulses.  Pulmonary/Chest: Effort normal and breath sounds normal.  Abdominal: Soft. Bowel sounds are normal. She exhibits distension (obese).  Musculoskeletal:     Cervical back: Normal range of motion and neck supple.     Comments: Limited ROM in spine.   Neurological: She is alert and oriented to person, place, and time. She has normal reflexes.  Skin: Skin is warm and dry.  Psychiatric: She has a normal mood and affect. Her behavior is normal. Judgment and thought content normal.  Nursing note and vitals reviewed.   BP 120/65   Pulse 61   Temp 98.7 F (37.1 C) (Oral)   Ht 5\' 4"  (1.626 m)  Wt 169 lb 3.2 oz (76.7 kg)   SpO2 98%   BMI 29.04 kg/m  Wt Readings from Last 3 Encounters:  01/29/20 169 lb 3.2 oz (76.7 kg)  12/17/19 145 lb (65.8 kg)     Health Maintenance Due  Topic Date Due  . Hepatitis C Screening  June 28, 1951  . TETANUS/TDAP  10/05/1972  . MAMMOGRAM  10/06/2003  . COLONOSCOPY  10/06/2003  . DEXA SCAN  10/05/2018  . PNA vac Low Risk Adult (1 of 2 - PCV13) 10/05/2018  . INFLUENZA VACCINE  07/13/2019    There are no preventive care reminders to display for this patient.  Lab Results  Component Value Date   TSH 1.070 01/29/2020   Lab Results  Component Value Date   WBC 5.3 01/29/2020   HGB 12.3 01/29/2020   HCT 38.3 01/29/2020   MCV 87 01/29/2020   PLT 287 01/29/2020   Lab Results  Component Value Date   NA 139 01/29/2020   K 4.6 01/29/2020   CO2 22 01/29/2020   GLUCOSE 87 01/29/2020   BUN 16 01/29/2020   CREATININE 1.17 (H) 01/29/2020   BILITOT 0.4 01/29/2020   ALKPHOS 96 01/29/2020   AST 9 01/29/2020   ALT 8 01/29/2020   PROT 7.4 01/29/2020   ALBUMIN 4.2 01/29/2020   CALCIUM 12.3 (H) 01/29/2020   ANIONGAP 9 12/22/2019   Lab Results  Component Value Date   CHOL  209 (H) 01/29/2020   Lab Results  Component Value Date   HDL 69 01/29/2020   Lab Results  Component Value Date   LDLCALC 120 (H) 01/29/2020   Lab Results  Component Value Date   TRIG 116 01/29/2020   Lab Results  Component Value Date   CHOLHDL 3.0 01/29/2020   No results found for: HGBA1C  Patient Active Problem List   Diagnosis Date Noted  . Acute kidney injury due to COVID-19 ([place]) 12/18/2019  . AKI (acute kidney injury) ([place]) 12/17/2019  . Pneumonia due to COVID-19 virus 12/17/2019  . Dehydration 12/17/2019   Assessment & Plan:   1. Hospital discharge follow-up  2. Encounter to establish care  3. History of 2019 novel coronavirus disease (COVID-19) Positive for Coronavirus on 12/12/2019. Stable today.  - amoxicillin-clavulanate (AUGMENTIN) 875-125 MG tablet; Take 1 tablet by mouth 2 (two) times daily for 10 days.  Dispense: 20 tablet; Refill: 0  4. Congestive heart failure, unspecified HF chronicity, unspecified heart failure type ([place]) Stable. Continue meds as prescribed.  - albuterol (PROVENTIL) (2.5 MG/3ML) 0.083% nebulizer solution; Take 3 mLs (2.5 mg total) by nebulization every 6 (six) hours as needed for wheezing or shortness of breath.  Dispense: 75 mL; Refill: 12  5. Moderate asthma without complication, unspecified whether persistent Stable. No signs or symptoms of respiratory distress noted or reported.  - albuterol (PROVENTIL) (2.5 MG/3ML) 0.083% nebulizer solution; Take 3 mLs (2.5 mg total) by nebulization every 6 (six) hours as needed for wheezing or shortness of breath.  Dispense: 75 mL; Refill: 12  6. Cough - albuterol (PROVENTIL) (2.5 MG/3ML) 0.083% nebulizer solution; Take 3 mLs (2.5 mg total) by nebulization every 6 (six) hours as needed for wheezing or shortness of breath.  Dispense: 75 mL; Refill: 12 - HYDROcodone-homatropine (HYCODAN) 5-1.5 MG/5ML syrup; Take 5 mLs by mouth every 8 (eight) hours as needed for cough.  Dispense: 120 mL; Refill:  0 - amoxicillin-clavulanate (AUGMENTIN) 875-125 MG tablet; Take 1 tablet by mouth 2 (two) times daily for 10 days.  Dispense: 20 tablet;  Refill: 0  7. Chest congestion We will initiate antibiotic today.  - amoxicillin-clavulanate (AUGMENTIN) 875-125 MG tablet; Take 1 tablet by mouth 2 (two) times daily for 10 days.  Dispense: 20 tablet; Refill: 0  8. Leukopenia, unspecified type - CBC with Differential  9. Vitamin D deficiency - Vitamin D, 25-hydroxy  10. Vitamin B12 deficiency - Vitamin B12  11. Former cigar smoker  12. Shortness of breath Stable today. No signs or symptoms of respiratory distress noted or reported.  - albuterol (PROVENTIL) (2.5 MG/3ML) 0.083% nebulizer solution; Take 3 mLs (2.5 mg total) by nebulization every 6 (six) hours as needed for wheezing or shortness of breath.  Dispense: 75 mL; Refill: 12  13. Health care maintenance - POCT urinalysis dipstick - Comprehensive metabolic panel - Lipid Panel - TSH  14. Follow up She will follow up in 1 month.   Meds ordered this encounter  Medications  . albuterol (PROVENTIL) (2.5 MG/3ML) 0.083% nebulizer solution    Sig: Take 3 mLs (2.5 mg total) by nebulization every 6 (six) hours as needed for wheezing or shortness of breath.    Dispense:  75 mL    Refill:  12  . HYDROcodone-homatropine (HYCODAN) 5-1.5 MG/5ML syrup    Sig: Take 5 mLs by mouth every 8 (eight) hours as needed for cough.    Dispense:  120 mL    Refill:  0  . amoxicillin-clavulanate (AUGMENTIN) 875-125 MG tablet    Sig: Take 1 tablet by mouth 2 (two) times daily for 10 days.    Dispense:  20 tablet    Refill:  0  . Nebulizer MISC    Sig: 1 each by Does not apply route every 6 (six) hours as needed.    Dispense:  1 each    Refill:  0    Nebulizer machine  . albuterol (VENTOLIN HFA) 108 (90 Base) MCG/ACT inhaler    Sig: Inhale 2 puffs into the lungs every 4 (four) hours as needed for wheezing or shortness of breath.    Dispense:  18 g     Refill:  12  . feeding supplement, ENSURE ENLIVE, (ENSURE ENLIVE) LIQD    Sig: Take 237 mLs by mouth 2 (two) times daily between meals.    Dispense:  237 mL    Refill:  12  . DISCONTD: fluticasone (FLONASE) 50 MCG/ACT nasal spray    Sig: Place 2 sprays into both nostrils daily.    Dispense:  16 g    Refill:  0  . losartan-hydrochlorothiazide (HYZAAR) 50-12.5 MG tablet    Sig: Take 1 tablet by mouth daily.    Dispense:  30 tablet    Refill:  6  . lubiprostone (AMITIZA) 24 MCG capsule    Sig: Take 1 capsule (24 mcg total) by mouth 2 (two) times daily with a meal.    Dispense:  60 capsule    Refill:  6  . omeprazole (PRILOSEC) 20 MG capsule    Sig: Take 1 capsule (20 mg total) by mouth daily.    Dispense:  30 capsule    Refill:  6  . potassium chloride (KLOR-CON) 10 MEQ tablet    Sig: Take 1 tablet (10 mEq total) by mouth 2 (two) times daily.    Dispense:  60 tablet    Refill:  3  . sodium chloride (OCEAN) 0.65 % SOLN nasal spray    Sig: Place 1 spray into both nostrils as needed for congestion.    Dispense:  30 mL  Refill:  6  . tiZANidine (ZANAFLEX) 4 MG tablet    Sig: Take 1-2 tablets (4-8 mg total) by mouth every 6 (six) hours as needed for muscle spasms.    Dispense:  60 tablet    Refill:  3    Orders Placed This Encounter  Procedures  . CBC with Differential  . Comprehensive metabolic panel  . Lipid Panel  . TSH  . Vitamin B12  . Vitamin D, 25-hydroxy  . POCT urinalysis dipstick    Referral Orders  No referral(s) requested today    Raliegh Ip,  MSN, FNP-BC [facility] [address] [place], [place] [address] [phone] fax   Problem List Items Addressed This Visit    None    Visit Diagnoses    Hospital discharge follow-up    -  Primary   Encounter to establish care       History of 2019 novel coronavirus disease (COVID-19)       Relevant Medications    amoxicillin-clavulanate (AUGMENTIN) 875-125 MG tablet   Congestive heart failure, unspecified HF chronicity, unspecified heart failure type (HCC)       Relevant Medications   albuterol (PROVENTIL) (2.5 MG/3ML) 0.083% nebulizer solution   losartan-hydrochlorothiazide (HYZAAR) 50-12.5 MG tablet   Moderate asthma without complication, unspecified whether persistent       Relevant Medications   albuterol (PROVENTIL) (2.5 MG/3ML) 0.083% nebulizer solution   albuterol (VENTOLIN HFA) 108 (90 Base) MCG/ACT inhaler   Cough       Relevant Medications   albuterol (PROVENTIL) (2.5 MG/3ML) 0.083% nebulizer solution   HYDROcodone-homatropine (HYCODAN) 5-1.5 MG/5ML syrup   amoxicillin-clavulanate (AUGMENTIN) 875-125 MG tablet   Chest congestion       Leukopenia, unspecified type       Relevant Orders   CBC with Differential (Completed)   Vitamin D deficiency       Relevant Orders   Vitamin D, 25-hydroxy (Completed)   Vitamin B12 deficiency       Relevant Orders   Vitamin B12 (Completed)   Former cigar smoker       Shortness of breath       Relevant Medications   albuterol (PROVENTIL) (2.5 MG/3ML) 0.083% nebulizer solution   Health care maintenance       Relevant Orders   POCT urinalysis dipstick (Completed)   Comprehensive metabolic panel (Completed)   Lipid Panel (Completed)   TSH (Completed)   Follow up          Meds ordered this encounter  Medications  . albuterol (PROVENTIL) (2.5 MG/3ML) 0.083% nebulizer solution    Sig: Take 3 mLs (2.5 mg total) by nebulization every 6 (six) hours as needed for wheezing or shortness of breath.    Dispense:  75 mL    Refill:  12  . HYDROcodone-homatropine (HYCODAN) 5-1.5 MG/5ML syrup    Sig: Take 5 mLs by mouth every 8 (eight) hours as needed for cough.    Dispense:  120 mL    Refill:  0  . amoxicillin-clavulanate (AUGMENTIN) 875-125 MG tablet    Sig: Take 1 tablet by mouth 2 (two) times daily for 10 days.    Dispense:  20 tablet    Refill:  0   . Nebulizer MISC    Sig: 1 each by Does not apply route every 6 (six) hours as needed.    Dispense:  1 each    Refill:  0  Nebulizer machine  . albuterol (VENTOLIN HFA) 108 (90 Base) MCG/ACT inhaler    Sig: Inhale 2 puffs into the lungs every 4 (four) hours as needed for wheezing or shortness of breath.    Dispense:  18 g    Refill:  12  . feeding supplement, ENSURE ENLIVE, (ENSURE ENLIVE) LIQD    Sig: Take 237 mLs by mouth 2 (two) times daily between meals.    Dispense:  237 mL    Refill:  12  . DISCONTD: fluticasone (FLONASE) 50 MCG/ACT nasal spray    Sig: Place 2 sprays into both nostrils daily.    Dispense:  16 g    Refill:  0  . losartan-hydrochlorothiazide (HYZAAR) 50-12.5 MG tablet    Sig: Take 1 tablet by mouth daily.    Dispense:  30 tablet    Refill:  6  . lubiprostone (AMITIZA) 24 MCG capsule    Sig: Take 1 capsule (24 mcg total) by mouth 2 (two) times daily with a meal.    Dispense:  60 capsule    Refill:  6  . omeprazole (PRILOSEC) 20 MG capsule    Sig: Take 1 capsule (20 mg total) by mouth daily.    Dispense:  30 capsule    Refill:  6  . potassium chloride (KLOR-CON) 10 MEQ tablet    Sig: Take 1 tablet (10 mEq total) by mouth 2 (two) times daily.    Dispense:  60 tablet    Refill:  3  . sodium chloride (OCEAN) 0.65 % SOLN nasal spray    Sig: Place 1 spray into both nostrils as needed for congestion.    Dispense:  30 mL    Refill:  6  . tiZANidine (ZANAFLEX) 4 MG tablet    Sig: Take 1-2 tablets (4-8 mg total) by mouth every 6 (six) hours as needed for muscle spasms.    Dispense:  60 tablet    Refill:  3    Follow-up: Return in about 1 month (around 02/26/2020).    Kallie Locks, FNP

## 2020-01-30 LAB — COMPREHENSIVE METABOLIC PANEL
ALT: 8 IU/L (ref 0–32)
AST: 9 IU/L (ref 0–40)
Albumin/Globulin Ratio: 1.3 (ref 1.2–2.2)
Albumin: 4.2 g/dL (ref 3.8–4.8)
Alkaline Phosphatase: 96 IU/L (ref 39–117)
BUN/Creatinine Ratio: 14 (ref 12–28)
BUN: 16 mg/dL (ref 8–27)
Bilirubin Total: 0.4 mg/dL (ref 0.0–1.2)
CO2: 22 mmol/L (ref 20–29)
Calcium: 12.3 mg/dL — ABNORMAL HIGH (ref 8.7–10.3)
Chloride: 104 mmol/L (ref 96–106)
Creatinine, Ser: 1.17 mg/dL — ABNORMAL HIGH (ref 0.57–1.00)
GFR calc Af Amer: 56 mL/min/{1.73_m2} — ABNORMAL LOW (ref >59)
GFR calc non Af Amer: 49 mL/min/{1.73_m2} — ABNORMAL LOW (ref >59)
Globulin, Total: 3.2 g/dL (ref 1.5–4.5)
Glucose: 87 mg/dL (ref 65–99)
Potassium: 4.6 mmol/L (ref 3.5–5.2)
Sodium: 139 mmol/L (ref 134–144)
Total Protein: 7.4 g/dL (ref 6.0–8.5)

## 2020-01-30 LAB — CBC WITH DIFFERENTIAL/PLATELET
Basophils Absolute: 0 10*3/uL (ref 0.0–0.2)
Basos: 1 %
EOS (ABSOLUTE): 0.1 10*3/uL (ref 0.0–0.4)
Eos: 2 %
Hematocrit: 38.3 % (ref 34.0–46.6)
Hemoglobin: 12.3 g/dL (ref 11.1–15.9)
Immature Grans (Abs): 0 10*3/uL (ref 0.0–0.1)
Immature Granulocytes: 0 %
Lymphocytes Absolute: 2.3 10*3/uL (ref 0.7–3.1)
Lymphs: 43 %
MCH: 27.9 pg (ref 26.6–33.0)
MCHC: 32.1 g/dL (ref 31.5–35.7)
MCV: 87 fL (ref 79–97)
Monocytes Absolute: 0.3 10*3/uL (ref 0.1–0.9)
Monocytes: 5 %
Neutrophils Absolute: 2.6 10*3/uL (ref 1.4–7.0)
Neutrophils: 49 %
Platelets: 287 10*3/uL (ref 150–450)
RBC: 4.41 x10E6/uL (ref 3.77–5.28)
RDW: 13.6 % (ref 11.7–15.4)
WBC: 5.3 10*3/uL (ref 3.4–10.8)

## 2020-01-30 LAB — LIPID PANEL
Chol/HDL Ratio: 3 ratio (ref 0.0–4.4)
Cholesterol, Total: 209 mg/dL — ABNORMAL HIGH (ref 100–199)
HDL: 69 mg/dL (ref >39)
LDL Chol Calc (NIH): 120 mg/dL — ABNORMAL HIGH (ref 0–99)
Triglycerides: 116 mg/dL (ref 0–149)
VLDL Cholesterol Cal: 20 mg/dL (ref 5–40)

## 2020-01-30 LAB — VITAMIN D 25 HYDROXY (VIT D DEFICIENCY, FRACTURES): Vit D, 25-Hydroxy: 16.7 ng/mL — ABNORMAL LOW (ref 30.0–100.0)

## 2020-01-30 LAB — TSH: TSH: 1.07 u[IU]/mL (ref 0.450–4.500)

## 2020-01-30 LAB — VITAMIN B12: Vitamin B-12: 723 pg/mL (ref 232–1245)

## 2020-02-02 ENCOUNTER — Encounter: Payer: Self-pay | Admitting: Family Medicine

## 2020-02-02 DIAGNOSIS — J45909 Unspecified asthma, uncomplicated: Secondary | ICD-10-CM | POA: Insufficient documentation

## 2020-02-02 DIAGNOSIS — I509 Heart failure, unspecified: Secondary | ICD-10-CM | POA: Insufficient documentation

## 2020-02-02 DIAGNOSIS — Z8616 Personal history of COVID-19: Secondary | ICD-10-CM | POA: Insufficient documentation

## 2020-02-02 DIAGNOSIS — Z87891 Personal history of nicotine dependence: Secondary | ICD-10-CM | POA: Insufficient documentation

## 2020-02-04 ENCOUNTER — Other Ambulatory Visit: Payer: Self-pay | Admitting: Family Medicine

## 2020-02-04 ENCOUNTER — Encounter: Payer: Self-pay | Admitting: Family Medicine

## 2020-02-04 DIAGNOSIS — E559 Vitamin D deficiency, unspecified: Secondary | ICD-10-CM

## 2020-02-04 MED ORDER — VITAMIN D (ERGOCALCIFEROL) 1.25 MG (50000 UNIT) PO CAPS: 50000.0000 [IU] | ORAL_CAPSULE | ORAL | 6 refills | Status: DC

## 2020-02-05 ENCOUNTER — Telehealth: Payer: Self-pay

## 2020-02-05 NOTE — Telephone Encounter (Signed)
-----   Message from Kallie Locks, FNP sent at 02/04/2020 11:52 AM EST ----- Cholesterol levels are mildly elevated. Continue low-fat, low cholesterol diet, increase fluids, increase fruits and vegetables. She will continue to decrease high sodium intake, excessive alcohol intake, increase potassium intake, smoking cessation, and increase physical activity of at least 30 minutes of cardio activity daily. She will continue to follow Heart Healthy or DASH diet.  Vitamin D level is moderately low. Rx for Vitamin D supplement sent to pharmacy today. She should include foods that are high in Vitamin D. These include: Salmon, Cod Liver Oil, Mushrooms, Canned Fish, Milk, and Egg Yolks.   All other labs are stable. Keep follow up appointment. Please inform patient.

## 2020-02-05 NOTE — Telephone Encounter (Signed)
Results left on patient voicemail.

## 2020-02-26 ENCOUNTER — Other Ambulatory Visit: Payer: Self-pay

## 2020-02-26 ENCOUNTER — Ambulatory Visit (INDEPENDENT_AMBULATORY_CARE_PROVIDER_SITE_OTHER): Payer: Medicare Other | Admitting: Family Medicine

## 2020-02-26 ENCOUNTER — Encounter: Payer: Self-pay | Admitting: Family Medicine

## 2020-02-26 VITALS — BP 126/60 | HR 64 | Temp 98.6°F | Ht 64.0 in | Wt 167.4 lb

## 2020-02-26 DIAGNOSIS — J45909 Unspecified asthma, uncomplicated: Secondary | ICD-10-CM | POA: Diagnosis not present

## 2020-02-26 DIAGNOSIS — R0602 Shortness of breath: Secondary | ICD-10-CM

## 2020-02-26 DIAGNOSIS — R0989 Other specified symptoms and signs involving the circulatory and respiratory systems: Secondary | ICD-10-CM

## 2020-02-26 DIAGNOSIS — Z8616 Personal history of COVID-19: Secondary | ICD-10-CM

## 2020-02-26 DIAGNOSIS — Z09 Encounter for follow-up examination after completed treatment for conditions other than malignant neoplasm: Secondary | ICD-10-CM | POA: Diagnosis not present

## 2020-02-26 DIAGNOSIS — R05 Cough: Secondary | ICD-10-CM

## 2020-02-26 DIAGNOSIS — R110 Nausea: Secondary | ICD-10-CM

## 2020-02-26 DIAGNOSIS — H547 Unspecified visual loss: Secondary | ICD-10-CM

## 2020-02-26 DIAGNOSIS — I509 Heart failure, unspecified: Secondary | ICD-10-CM | POA: Diagnosis not present

## 2020-02-26 DIAGNOSIS — R059 Cough, unspecified: Secondary | ICD-10-CM

## 2020-02-26 LAB — POCT URINALYSIS DIPSTICK
Bilirubin, UA: NEGATIVE
Blood, UA: NEGATIVE
Glucose, UA: NEGATIVE
Ketones, UA: NEGATIVE
Leukocytes, UA: NEGATIVE
Nitrite, UA: NEGATIVE
Protein, UA: NEGATIVE
Spec Grav, UA: >=1.03 — AB (ref 1.010–1.025)
Urobilinogen, UA: 0.2 E.U./dL
pH, UA: 5.5 (ref 5.0–8.0)

## 2020-02-26 MED ORDER — ONDANSETRON HCL 4 MG PO TABS: 4.0000 mg | ORAL_TABLET | Freq: Four times a day (QID) | ORAL | 3 refills | Status: DC | PRN

## 2020-02-26 NOTE — Progress Notes (Signed)
[facility]    Established Patient Office Visit  Subjective:  Patient ID: Kylie Sanders, female    DOB: August 09, 1951  Age: 69 y.o. MRN: [id number]  CC:  Chief Complaint  Patient presents with  . Follow-up    HPI Kylie Sanders is a 69 year old female who presents for Follow Up today.   Past Medical History:  Diagnosis Date  . Asthma   . CHF (congestive heart failure) (HCC)   . Diabetes mellitus without complication (HCC)   . History of 2019 novel coronavirus disease (COVID-19) 12/12/2019  . Hyperlipidemia   . Hypertension   . Hypokalemia 12/2019  . Myocardial infarction (HCC)   . Seizures (HCC)   . Stroke (HCC)   . Vitamin D deficiency 01/2020   Current Status: Since her last office visit, she has been having recent visual changes. She denies chest pain, cough, shortness of breath, heart palpitations, and falls. She has occasional headaches and dizziness with position changes. Denies severe headaches, confusion, seizures, double vision, and blurred vision, nausea and vomiting. She denies fatigue, frequent urination, blurred vision, excessive hunger, excessive thirst, weight gain, weight loss, and poor wound healing. She continues to check her feet regularly. She denies fevers, chills, recent infections, weight loss, and night sweats. No reports of GI problems such as diarrhea, and constipation. She has no reports of blood in stools, dysuria and hematuria. No depression or anxiety reported oday. She denies suicidal ideations, homicidal ideations, or auditory hallucinations. She denies pain today.   Past Surgical History:  Procedure Laterality Date  . PACEMAKER IMPLANT      No family history on file.  Social History   Socioeconomic History  . Marital status: Single    Spouse name: Not on file  . Number of children: Not on file  . Years of education: Not on file  . Highest education level: Not on file  Occupational History   . Not on file  Tobacco Use  . Smoking status: Former Games developer  . Smokeless tobacco: Never Used  Substance and Sexual Activity  . Alcohol use: Not Currently  . Drug use: Never  . Sexual activity: Not Currently  Other Topics Concern  . Not on file  Social History Narrative  . Not on file   Social Determinants of Health   Financial Resource Strain:   . Difficulty of Paying Living Expenses:   Food Insecurity:   . Worried About Programme researcher, broadcasting/film/video in the Last Year:   . Barista in the Last Year:   Transportation Needs:   . Freight forwarder (Medical):   Marland Kitchen Lack of Transportation (Non-Medical):   Physical Activity:   . Days of Exercise per Week:   . Minutes of Exercise per Session:   Stress:   . Feeling of Stress :   Social Connections:   . Frequency of Communication with Friends and Family:   . Frequency of Social Gatherings with Friends and Family:   . Attends Religious Services:   . Active Member of Clubs or Organizations:   . Attends Banker Meetings:   Marland Kitchen Marital Status:   Intimate Partner Violence:   . Fear of Current or Ex-Partner:   . Emotionally Abused:   Marland Kitchen Physically Abused:   . Sexually Abused:     Outpatient Medications Prior to Visit  Medication Sig Dispense Refill  . albuterol (PROVENTIL) (2.5 MG/3ML) 0.083% nebulizer solution Take 3 mLs (2.5 mg total)  by nebulization every 6 (six) hours as needed for wheezing or shortness of breath. 75 mL 12  . albuterol (VENTOLIN HFA) 108 (90 Base) MCG/ACT inhaler Inhale 2 puffs into the lungs every 4 (four) hours as needed for wheezing or shortness of breath. 18 g 12  . feeding supplement, ENSURE ENLIVE, (ENSURE ENLIVE) LIQD Take 237 mLs by mouth 2 (two) times daily between meals. 237 mL 12  . fluticasone (FLONASE) 50 MCG/ACT nasal spray SHAKE LIQUID AND USE 2 SPRAYS IN EACH NOSTRIL DAILY 48 g 3  . HYDROcodone-homatropine (HYCODAN) 5-1.5 MG/5ML syrup Take 5 mLs by mouth every 8 (eight) hours as needed  for cough. 120 mL 0  . losartan-hydrochlorothiazide (HYZAAR) 50-12.5 MG tablet Take 1 tablet by mouth daily. 30 tablet 6  . lubiprostone (AMITIZA) 24 MCG capsule Take 1 capsule (24 mcg total) by mouth 2 (two) times daily with a meal. 60 capsule 6  . Nebulizer MISC 1 each by Does not apply route every 6 (six) hours as needed. 1 each 0  . omeprazole (PRILOSEC) 20 MG capsule Take 1 capsule (20 mg total) by mouth daily. 30 capsule 6  . ondansetron (ZOFRAN) 4 MG tablet Take 1 tablet (4 mg total) by mouth every 6 (six) hours as needed for nausea. 20 tablet 0  . potassium chloride (KLOR-CON) 10 MEQ tablet Take 1 tablet (10 mEq total) by mouth 2 (two) times daily. 60 tablet 3  . sodium chloride (OCEAN) 0.65 % SOLN nasal spray Place 1 spray into both nostrils as needed for congestion. 30 mL 6  . tiZANidine (ZANAFLEX) 4 MG tablet Take 1-2 tablets (4-8 mg total) by mouth every 6 (six) hours as needed for muscle spasms. 60 tablet 3  . Vitamin D, Ergocalciferol, (DRISDOL) 1.25 MG (50000 UNIT) CAPS capsule Take 1 capsule (50,000 Units total) by mouth every 7 (seven) days. 5 capsule 6   No facility-administered medications prior to visit.    No Known Allergies  ROS Review of Systems  Constitutional: Negative.   HENT: Negative.   Eyes: Positive for visual disturbance.  Respiratory: Positive for cough (occasional).   Cardiovascular: Negative.   Gastrointestinal: Positive for nausea (occasional ).  Endocrine: Negative.   Genitourinary: Negative.   Musculoskeletal: Negative.   Skin: Negative.   Allergic/Immunologic: Negative.   Neurological: Positive for dizziness (occasional ).  Hematological: Negative.   Psychiatric/Behavioral: Negative.       Objective:    Physical Exam  Constitutional: She is oriented to person, place, and time. She appears well-developed and well-nourished.  HENT:  Head: Normocephalic and atraumatic.  Eyes: Conjunctivae are normal.  Cardiovascular: Normal rate, regular  rhythm, normal heart sounds and intact distal pulses.  Pulmonary/Chest: Effort normal and breath sounds normal.  Abdominal: Soft. Bowel sounds are normal.  Musculoskeletal:        General: Normal range of motion.     Cervical back: Normal range of motion and neck supple.  Neurological: She is oriented to person, place, and time.  Skin: Skin is warm and dry.  Psychiatric: She has a normal mood and affect. Her behavior is normal. Judgment and thought content normal.  Nursing note and vitals reviewed.   There were no vitals taken for this visit. Wt Readings from Last 3 Encounters:  01/29/20 169 lb 3.2 oz (76.7 kg)  12/17/19 145 lb (65.8 kg)     Health Maintenance Due  Topic Date Due  . Hepatitis C Screening  Never done  . TETANUS/TDAP  Never done  .  MAMMOGRAM  Never done  . COLONOSCOPY  Never done  . DEXA SCAN  Never done  . PNA vac Low Risk Adult (1 of 2 - PCV13) 10/05/2018  . INFLUENZA VACCINE  07/13/2019    There are no preventive care reminders to display for this patient.  Lab Results  Component Value Date   TSH 1.070 01/29/2020   Lab Results  Component Value Date   WBC 5.3 01/29/2020   HGB 12.3 01/29/2020   HCT 38.3 01/29/2020   MCV 87 01/29/2020   PLT 287 01/29/2020   Lab Results  Component Value Date   NA 139 01/29/2020   K 4.6 01/29/2020   CO2 22 01/29/2020   GLUCOSE 87 01/29/2020   BUN 16 01/29/2020   CREATININE 1.17 (H) 01/29/2020   BILITOT 0.4 01/29/2020   ALKPHOS 96 01/29/2020   AST 9 01/29/2020   ALT 8 01/29/2020   PROT 7.4 01/29/2020   ALBUMIN 4.2 01/29/2020   CALCIUM 12.3 (H) 01/29/2020   ANIONGAP 9 12/22/2019   Lab Results  Component Value Date   CHOL 209 (H) 01/29/2020   Lab Results  Component Value Date   HDL 69 01/29/2020   Lab Results  Component Value Date   LDLCALC 120 (H) 01/29/2020   Lab Results  Component Value Date   TRIG 116 01/29/2020   Lab Results  Component Value Date   CHOLHDL 3.0 01/29/2020   No results  found for: HGBA1C    Assessment & Plan:   1. Congestive heart failure, unspecified HF chronicity, unspecified heart failure type (HCC) Stable. No signs or symptoms of respiratory distress noted or reported today.   2. Moderate asthma without complication, unspecified whether persistent  3. History of 2019 novel coronavirus disease (COVID-19) Positive for Coronavirus on 12/12/2019. Stable today.  4. Cough  5. Chest congestion  6. Shortness of breath  7. Follow up She will follow up in 3 months.   No orders of the defined types were placed in this encounter.   No orders of the defined types were placed in this encounter.   Referral Orders  No referral(s) requested today    Raliegh Ip,  MSN, FNP-BC [facility] [address] [place], [place] [address] [phone] fax  Problem List Items Addressed This Visit      Cardiovascular and Mediastinum   Congestive heart failure (HCC) - Primary     Respiratory   Moderate asthma without complication     Other   History of 2019 novel coronavirus disease (COVID-19)    Other Visit Diagnoses    Cough       Chest congestion       Shortness of breath       Follow up          No orders of the defined types were placed in this encounter.   Follow-up: No follow-ups on file.    Kallie Locks, FNP

## 2020-03-01 DIAGNOSIS — R110 Nausea: Secondary | ICD-10-CM | POA: Insufficient documentation

## 2020-03-01 DIAGNOSIS — R0989 Other specified symptoms and signs involving the circulatory and respiratory systems: Secondary | ICD-10-CM | POA: Insufficient documentation

## 2020-03-01 DIAGNOSIS — R0602 Shortness of breath: Secondary | ICD-10-CM | POA: Insufficient documentation

## 2020-03-18 ENCOUNTER — Other Ambulatory Visit: Payer: Self-pay

## 2020-03-18 DIAGNOSIS — R0602 Shortness of breath: Secondary | ICD-10-CM

## 2020-03-18 DIAGNOSIS — R05 Cough: Secondary | ICD-10-CM

## 2020-03-18 DIAGNOSIS — I509 Heart failure, unspecified: Secondary | ICD-10-CM

## 2020-03-18 DIAGNOSIS — R059 Cough, unspecified: Secondary | ICD-10-CM

## 2020-03-18 DIAGNOSIS — J45909 Unspecified asthma, uncomplicated: Secondary | ICD-10-CM

## 2020-03-18 MED ORDER — ALBUTEROL SULFATE (2.5 MG/3ML) 0.083% IN NEBU: 2.5000 mg | INHALATION_SOLUTION | Freq: Four times a day (QID) | RESPIRATORY_TRACT | 12 refills | Status: DC | PRN

## 2020-04-14 ENCOUNTER — Other Ambulatory Visit: Payer: Self-pay | Admitting: Family Medicine

## 2020-05-27 ENCOUNTER — Ambulatory Visit (INDEPENDENT_AMBULATORY_CARE_PROVIDER_SITE_OTHER): Payer: Medicare Other | Admitting: Family Medicine

## 2020-05-27 ENCOUNTER — Other Ambulatory Visit: Payer: Self-pay

## 2020-05-27 VITALS — BP 126/65 | HR 62 | Temp 97.0°F | Wt 169.0 lb

## 2020-05-27 DIAGNOSIS — R059 Cough, unspecified: Secondary | ICD-10-CM

## 2020-05-27 DIAGNOSIS — R0989 Other specified symptoms and signs involving the circulatory and respiratory systems: Secondary | ICD-10-CM

## 2020-05-27 DIAGNOSIS — R0602 Shortness of breath: Secondary | ICD-10-CM

## 2020-05-27 DIAGNOSIS — R05 Cough: Secondary | ICD-10-CM

## 2020-05-27 DIAGNOSIS — I509 Heart failure, unspecified: Secondary | ICD-10-CM | POA: Diagnosis not present

## 2020-05-27 DIAGNOSIS — Z09 Encounter for follow-up examination after completed treatment for conditions other than malignant neoplasm: Secondary | ICD-10-CM

## 2020-05-27 DIAGNOSIS — J45909 Unspecified asthma, uncomplicated: Secondary | ICD-10-CM | POA: Diagnosis not present

## 2020-05-27 MED ORDER — AMOXICILLIN-POT CLAVULANATE 875-125 MG PO TABS: 1.0000 | ORAL_TABLET | Freq: Two times a day (BID) | ORAL | 0 refills | Status: AC

## 2020-05-27 MED ORDER — HYDROCODONE-HOMATROPINE 5-1.5 MG/5ML PO SYRP: 5.0000 mL | ORAL_SOLUTION | Freq: Three times a day (TID) | ORAL | 0 refills | Status: DC | PRN

## 2020-05-27 NOTE — Patient Instructions (Signed)
Cough, Adult A cough helps to clear your throat and lungs. A cough may be a sign of an illness or another medical condition. An acute cough may only last 2-3 weeks, while a chronic cough may last 8 or more weeks. Many things can cause a cough. They include:  Germs (viruses or bacteria) that attack the airway.  Breathing in things that bother (irritate) your lungs.  Allergies.  Asthma.  Mucus that runs down the back of your throat (postnasal drip).  Smoking.  Acid backing up from the stomach into the tube that moves food from the mouth to the stomach (gastroesophageal reflux).  Some medicines.  Lung problems.  Other medical conditions, such as heart failure or a blood clot in the lung (pulmonary embolism). Follow these instructions at home: Medicines  Take over-the-counter and prescription medicines only as told by your doctor.  Talk with your doctor before you take medicines that stop a cough (coughsuppressants). Lifestyle   Do not smoke, and try not to be around smoke. Do not use any products that contain nicotine or tobacco, such as cigarettes, e-cigarettes, and chewing tobacco. If you need help quitting, ask your doctor.  Drink enough fluid to keep your pee (urine) pale yellow.  Avoid caffeine.  Do not drink alcohol if your doctor tells you not to drink. General instructions   Watch for any changes in your cough. Tell your doctor about them.  Always cover your mouth when you cough.  Stay away from things that make you cough, such as perfume, candles, campfire smoke, or cleaning products.  If the air is dry, use a cool mist vaporizer or humidifier in your home.  If your cough is worse at night, try using extra pillows to raise your head up higher while you sleep.  Rest as needed.  Keep all follow-up visits as told by your doctor. This is important. Contact a doctor if:  You have new symptoms.  You cough up pus.  Your cough does not get better after 2-3  weeks, or your cough gets worse.  Cough medicine does not help your cough and you are not sleeping well.  You have pain that gets worse or pain that is not helped with medicine.  You have a fever.  You are losing weight and you do not know why.  You have night sweats. Get help right away if:  You cough up blood.  You have trouble breathing.  Your heartbeat is very fast. These symptoms may be an emergency. Do not wait to see if the symptoms will go away. Get medical help right away. Call your local emergency services (911 in the [place] Do not drive yourself to the hospital. Summary  A cough helps to clear your throat and lungs. Many things can cause a cough.  Take over-the-counter and prescription medicines only as told by your doctor.  Always cover your mouth when you cough.  Contact a doctor if you have new symptoms or you have a cough that does not get better or gets worse. This information is not intended to replace advice given to you by your health care provider. Make sure you discuss any questions you have with your health care provider. Document Revised: 12/17/2018 Document Reviewed: 12/17/2018 Elsevier Patient Education  [place]. Amoxicillin; Clavulanic Acid Tablets What is this medicine? AMOXICILLIN; CLAVULANIC ACID (a mox i SIL in; KLAV yoo lan ic AS id) is a penicillin antibiotic. It treats some infections caused by bacteria. It will not work  for colds, the flu, or other viruses. This medicine may be used for other purposes; ask your health care provider or pharmacist if you have questions. COMMON BRAND NAME(S): Augmentin What should I tell my health care provider before I take this medicine? They need to know if you have any of these conditions:  bowel disease, like colitis  kidney disease  liver disease  mononucleosis  an unusual or allergic reaction to amoxicillin, penicillin, cephalosporin, other antibiotics, clavulanic acid, other medicines,  foods, dyes, or preservatives  pregnant or trying to get pregnant  breast-feeding How should I use this medicine? Take this drug by mouth. Take it as directed on the prescription label at the same time every day. Take it with food at the start of a meal or snack. Take all of this drug unless your health care provider tells you to stop it early. Keep taking it even if you think you are better. Talk to your health care provider about the use of this drug in children. While it may be prescribed for selected conditions, precautions do apply. Overdosage: If you think you have taken too much of this medicine contact a poison control center or emergency room at once. NOTE: This medicine is only for you. Do not share this medicine with others. What if I miss a dose? If you miss a dose, take it as soon as you can. If it is almost time for your next dose, take only that dose. Do not take double or extra doses. What may interact with this medicine?  allopurinol  anticoagulants  birth control pills  methotrexate  probenecid This list may not describe all possible interactions. Give your health care provider a list of all the medicines, herbs, non-prescription drugs, or dietary supplements you use. Also tell them if you smoke, drink alcohol, or use illegal drugs. Some items may interact with your medicine. What should I watch for while using this medicine? Tell your doctor or healthcare provider if your symptoms do not improve. This medicine may cause serious skin reactions. They can happen weeks to months after starting the medicine. Contact your healthcare provider right away if you notice fevers or flu-like symptoms with a rash. The rash may be red or purple and then turn into blisters or peeling of the skin. Or, you might notice a red rash with swelling of the face, lips or lymph nodes in your neck or under your arms. Do not treat diarrhea with over the counter products. Contact your doctor if you  have diarrhea that lasts more than 2 days or if it is severe and watery. If you have diabetes, you may get a false-positive result for sugar in your urine. Check with your doctor or healthcare provider. Birth control pills may not work properly while you are taking this medicine. Talk to your doctor about using an extra method of birth control. What side effects may I notice from receiving this medicine? Side effects that you should report to your doctor or health care professional as soon as possible:  allergic reactions like skin rash, itching or hives, swelling of the face, lips, or tongue  breathing problems  dark urine  fever or chills, sore throat  redness, blistering, peeling, or loosening of the skin, including inside the mouth  seizures  trouble passing urine or change in the amount of urine  unusual bleeding, bruising  unusually weak or tired  white patches or sores in the mouth or throat Side effects that usually do not  require medical attention (report to your doctor or health care professional if they continue or are bothersome):  diarrhea  dizziness  headache  nausea, vomiting  stomach upset  vaginal or anal irritation This list may not describe all possible side effects. Call your doctor for medical advice about side effects. You may report side effects to FDA at 1-800-FDA-1088. Where should I keep my medicine? Keep out of the reach of children and pets. Store at room temperature between 20 and 25 degrees C (68 and 77 degrees F). Throw away any unused drug after the expiration date. NOTE: This sheet is a summary. It may not cover all possible information. If you have questions about this medicine, talk to your doctor, pharmacist, or health care provider.  2020 Elsevier/Gold Standard (2019-07-01 11:55:53) Homatropine; Hydrocodone oral syrup What is this medicine? HYDROCODONE (hye droe KOE done) is used to help relieve cough. This medicine may be used for  other purposes; ask your health care provider or pharmacist if you have questions. COMMON BRAND NAME(S): Hycodan, Hydromet, Hydropane, Mycodone What should I tell my health care provider before I take this medicine? They need to know if you have any of these conditions:  Addison's disease  brain tumor  gallbladder disease  glaucoma  head injury  heart disease  history of a drug or alcohol abuse problem  history of irregular heartbeat  if you often drink alcohol  kidney disease  liver disease  low blood pressure  lung or breathing disease, like asthma  mental illness  pancreatic disease  seizures  stomach or intestine problems  thyroid disease  trouble passing urine  an unusual or allergic reaction to hydrocodone, other medicines, foods, dyes, or preservatives  pregnant or trying to get pregnant  breast-feeding How should I use this medicine? Take this medicine by mouth. Follow the directions on the prescription label. You can take it with or without food. If it upsets your stomach, take it with food. Use a specially marked spoon or container to measure each dose. Ask your pharmacist if you do not have one. Household spoons are not accurate. Do not to overfill. Rinse the measuring device with water after each use. Take your medicine at regular intervals. Do not take it more often than directed. A special MedGuide will be given to you by the pharmacist with each prescription and refill. Be sure to read this information carefully each time. Talk to your pediatrician regarding the use of this medicine in children. This medicine is not approved for use in children. Overdosage: If you think you have taken too much of this medicine contact a poison control center or emergency room at once. NOTE: This medicine is only for you. Do not share this medicine with others. What if I miss a dose? If you miss a dose, take it as soon as you can. If it is almost time for your next  dose, take only that dose. Do not take double or extra doses. What may interact with this medicine? Do not take this medicine with any of the following medications:  alcohol  antihistamines for allergy, cough and cold  certain medicines for anxiety or sleep  certain medicines for depression like amitriptyline, fluoxetine, sertraline  certain medicines for seizures like carbamazepine, phenobarbital, phenytoin, primidone  general anesthetics like halothane, isoflurane, methoxyflurane, propofol  local anesthetics like lidocaine, pramoxine, tetracaine  MAOIs like Carbex, Eldepryl, Marplan, Nardil, and Parnate  other narcotic medicines (opiates) for pain or cough  phenothiazines like chlorpromazine, mesoridazine,  prochlorperazine, thioridazine This medicine may also interact with the following medications:  antiviral medicines for HIV and AIDS  atropine  certain antibiotics like clarithromycin, erythromycin  certain medicines for bladder problems like oxybutynin, tolterodine  certain medicines for fungal infections like ketoconazole and itraconazole  certain medicines for Parkinson's disease like benztropine, trihexyphenidyl  certain medicines for stomach problems like dicyclomine, hyoscyamine  certain medicines for travel sickness like scopolamine  ipratropium  rifampin This list may not describe all possible interactions. Give your health care provider a list of all the medicines, herbs, non-prescription drugs, or dietary supplements you use. Also tell them if you smoke, drink alcohol, or use illegal drugs. Some items may interact with your medicine. What should I watch for while using this medicine? Use exactly as directed by your doctor or health care professional. Do not take more than the recommended dose. You may develop tolerance to this medicine if you take it for a long time. Tolerance means that you will get less cough relief with time. Tell your doctor or health  care professional if your symptoms do not improve or if they get worse. If you have been taking this medicine for a long time, do not suddenly stop taking it because you may develop a severe reaction. Your body becomes used to the medicine. This does NOT mean you are addicted. Addiction is a behavior related to getting and using a drug for a nonmedical reason. If your doctor wants you to stop the medicine, the dose will be slowly lowered over time to avoid any side effects. There are different types of narcotic medicines (opiates). If you take more than one type at the same time or if you are taking another medicine that also causes drowsiness, you may have more side effects. Give your health care provider a list of all medicines you use. Your doctor will tell you how much medicine to take. Do not take more medicine than directed. Call emergency for help if you have problems breathing or unusual sleepiness. You may get drowsy or dizzy. Do not drive, use machinery, or do anything that needs mental alertness until you know how this medicine affects you. Do not stand or sit up quickly, especially if you are an older patient. This reduces the risk of dizzy or fainting spells. Alcohol may interfere with the effect of this medicine. Avoid alcoholic drinks. This medicine will cause constipation. Try to have a bowel movement at least every 2 to 3 days. If you do not have a bowel movement for 3 days, call your doctor or health care professional. Your mouth may get dry. Chewing sugarless gum or sucking hard candy, and drinking plenty of water may help. Contact your doctor if the problem does not go away or is severe. What side effects may I notice from receiving this medicine? Side effects that you should report to your doctor or health care professional as soon as possible:  allergic reactions like skin rash, itching or hives, swelling of the face, lips, or tongue  breathing problems  confusion  signs and  symptoms of low blood pressure like dizziness; feeling faint or lightheaded, falls; unusually weak or tired  trouble passing urine or change in the amount of urine Side effects that usually do not require medical attention (report to your doctor or health care professional if they continue or are bothersome):  constipation  dry mouth  nausea, vomiting  tiredness This list may not describe all possible side effects. Call your doctor for  medical advice about side effects. You may report side effects to FDA at 1-800-FDA-1088. Where should I keep my medicine? Keep out of the reach of children. This medicine can be abused. Keep your medicine in a safe place to protect it from theft. Do not share this medicine with anyone. Selling or giving away this medicine is dangerous and against the law. This medicine may cause accidental overdose and death if taken by other adults, children, or pets. Mix any unused medicine with a substance like cat littler or coffee grounds. Then throw the medicine away in a sealed container like a sealed bag or a coffee can with a lid. Do not use the medicine after the expiration date. Store at room temperature between 15 and 30 degrees C (59 and 86 degrees F). Protect from light. NOTE: This sheet is a summary. It may not cover all possible information. If you have questions about this medicine, talk to your doctor, pharmacist, or health care provider.  2020 Elsevier/Gold Standard (2017-06-22 16:00:40)

## 2020-05-27 NOTE — Progress Notes (Signed)
[facility]    Established Patient Office Visit  Subjective:  Patient ID: Kylie Sanders, female    DOB: 23-May-1951  Age: 69 y.o. MRN: [id number]  CC:  Chief Complaint  Patient presents with  . Cough    coughing up white thick mucus  . Back Pain    right side pain, walking is becoming difficult  . Medication Refill    needs rx for nebulizer    HPI Kylie Sanders is a 69 year old female who presents for Follow Up today.    Patient Active Problem List   Diagnosis Date Noted  . Shortness of breath 03/01/2020  . Chest congestion 03/01/2020  . Nausea 03/01/2020  . History of 2019 novel coronavirus disease (COVID-19) 02/02/2020  . Moderate asthma without complication 02/02/2020  . Congestive heart failure (HCC) 02/02/2020  . Former cigar smoker 02/02/2020  . Acute kidney injury due to COVID-19 (HCC) 12/18/2019  . AKI (acute kidney injury) (HCC) 12/17/2019  . Pneumonia due to COVID-19 virus 12/17/2019  . Dehydration 12/17/2019    Past Medical History:  Diagnosis Date  . Asthma   . CHF (congestive heart failure) (HCC)   . Diabetes mellitus without complication (HCC)   . History of 2019 novel coronavirus disease (COVID-19) 12/12/2019  . Hyperlipidemia   . Hypertension   . Hypokalemia 12/2019  . Myocardial infarction (HCC)   . Seizures (HCC)   . Stroke (HCC)   . Vitamin D deficiency 01/2020   Current Status: Since her last office visit, she is doing well with no complaints. She has c/o lower back pain. She also has c/o cough and congestion. She has not been able to use nebulizer as it does not work. She denies visual changes, chest pain, cough, shortness of breath, heart palpitations and falls. She has occasional headaches and dizziness with position changes. Denies severe headaches, confusion, seizures, double vision, and blurred vision. She denies fatigue, frequent urination, blurred vision, excessive hunger, excessive  thirst, weight gain, weight loss, and poor wound healing. She continues to check her feet regularly. She denies fevers, chills,  recent infections, weight loss, and night sweats. Denies GI problems such as diarrhea, and constipation. She has no reports of blood in stools, dysuria and hematuria. No depression or anxiety reported today. She denies suicidal ideations, homicidal ideations, or auditory hallucinations. She is taking all medications as prescribed. She denies pain today.   Past Surgical History:  Procedure Laterality Date  . PACEMAKER IMPLANT      No family history on file.  Social History   Socioeconomic History  . Marital status: Single    Spouse name: Not on file  . Number of children: Not on file  . Years of education: Not on file  . Highest education level: Not on file  Occupational History  . Not on file  Tobacco Use  . Smoking status: Former Games developer  . Smokeless tobacco: Never Used  Vaping Use  . Vaping Use: Never used  Substance and Sexual Activity  . Alcohol use: Not Currently  . Drug use: Never  . Sexual activity: Not Currently  Other Topics Concern  . Not on file  Social History Narrative  . Not on file   Social Determinants of Health   Financial Resource Strain:   . Difficulty of Paying Living Expenses:   Food Insecurity:   . Worried About Programme researcher, broadcasting/film/video in the Last Year:   . Barista in  the Last Year:   Transportation Needs:   . Freight forwarder (Medical):   Marland Kitchen Lack of Transportation (Non-Medical):   Physical Activity:   . Days of Exercise per Week:   . Minutes of Exercise per Session:   Stress:   . Feeling of Stress :   Social Connections:   . Frequency of Communication with Friends and Family:   . Frequency of Social Gatherings with Friends and Family:   . Attends Religious Services:   . Active Member of Clubs or Organizations:   . Attends Banker Meetings:   Marland Kitchen Marital Status:   Intimate Partner Violence:   .  Fear of Current or Ex-Partner:   . Emotionally Abused:   Marland Kitchen Physically Abused:   . Sexually Abused:     Outpatient Medications Prior to Visit  Medication Sig Dispense Refill  . albuterol (PROVENTIL) (2.5 MG/3ML) 0.083% nebulizer solution Take 3 mLs (2.5 mg total) by nebulization every 6 (six) hours as needed for wheezing or shortness of breath. 75 mL 12  . albuterol (VENTOLIN HFA) 108 (90 Base) MCG/ACT inhaler Inhale 2 puffs into the lungs every 4 (four) hours as needed for wheezing or shortness of breath. 18 g 12  . feeding supplement, ENSURE ENLIVE, (ENSURE ENLIVE) LIQD Take 237 mLs by mouth 2 (two) times daily between meals. 237 mL 12  . fluticasone (FLONASE) 50 MCG/ACT nasal spray SHAKE LIQUID AND USE 2 SPRAYS IN EACH NOSTRIL DAILY 48 g 3  . losartan-hydrochlorothiazide (HYZAAR) 50-12.5 MG tablet Take 1 tablet by mouth daily. 30 tablet 6  . lubiprostone (AMITIZA) 24 MCG capsule Take 1 capsule (24 mcg total) by mouth 2 (two) times daily with a meal. 60 capsule 6  . omeprazole (PRILOSEC) 20 MG capsule Take 1 capsule (20 mg total) by mouth daily. 30 capsule 6  . ondansetron (ZOFRAN) 4 MG tablet Take 1 tablet (4 mg total) by mouth every 6 (six) hours as needed for nausea. 30 tablet 3  . potassium chloride (KLOR-CON) 10 MEQ tablet Take 1 tablet (10 mEq total) by mouth 2 (two) times daily. 60 tablet 3  . sodium chloride (OCEAN) 0.65 % SOLN nasal spray Place 1 spray into both nostrils as needed for congestion. 30 mL 6  . Nebulizer MISC 1 each by Does not apply route every 6 (six) hours as needed. (Patient not taking: Reported on 02/26/2020) 1 each 0  . tiZANidine (ZANAFLEX) 4 MG tablet Take 1-2 tablets (4-8 mg total) by mouth every 6 (six) hours as needed for muscle spasms. (Patient not taking: Reported on 02/26/2020) 60 tablet 3  . Vitamin D, Ergocalciferol, (DRISDOL) 1.25 MG (50000 UNIT) CAPS capsule Take 1 capsule (50,000 Units total) by mouth every 7 (seven) days. (Patient not taking: Reported on  02/26/2020) 5 capsule 6   No facility-administered medications prior to visit.    No Known Allergies  ROS Review of Systems  Constitutional: Negative.   HENT: Negative.   Eyes: Negative.   Respiratory: Positive for cough (occasional ) and shortness of breath (occasional ).   Cardiovascular: Negative.   Gastrointestinal: Negative.   Endocrine: Negative.   Genitourinary: Negative.   Musculoskeletal: Positive for arthralgias (generalized joint pain) and back pain (chronic back pain).  Skin: Negative.   Allergic/Immunologic: Negative.   Neurological: Positive for dizziness (occasional) and headaches (occasional ).  Hematological: Negative.   Psychiatric/Behavioral: Negative.      Objective:    Physical Exam Vitals and nursing note reviewed.  Constitutional:  Appearance: Normal appearance.  HENT:     Head: Normocephalic and atraumatic.     Mouth/Throat:     Mouth: Mucous membranes are dry.  Cardiovascular:     Rate and Rhythm: Normal rate and regular rhythm.  Pulmonary:     Effort: Pulmonary effort is normal.     Breath sounds: Normal breath sounds.     Comments: Chest congestion Abdominal:     General: Bowel sounds are normal. There is distension (obese).     Palpations: Abdomen is soft.  Musculoskeletal:     Cervical back: Normal range of motion and neck supple.     Comments: Limited ROM in spine  Skin:    General: Skin is warm and dry.  Neurological:     General: No focal deficit present.     Mental Status: She is alert and oriented to person, place, and time.  Psychiatric:        Mood and Affect: Mood normal.        Behavior: Behavior normal.        Thought Content: Thought content normal.        Judgment: Judgment normal.     BP 126/65 (BP Location: Right Arm, Patient Position: Sitting, Cuff Size: Small)   Pulse 62   Temp (!) 97 F (36.1 C)   Wt 169 lb (76.7 kg)   SpO2 98%   BMI 29.01 kg/m  Wt Readings from Last 3 Encounters:  05/27/20 169 lb  (76.7 kg)  02/26/20 167 lb 6.4 oz (75.9 kg)  01/29/20 169 lb 3.2 oz (76.7 kg)     Health Maintenance Due  Topic Date Due  . Hepatitis C Screening  Never done  . COVID-19 Vaccine (1) Never done  . TETANUS/TDAP  Never done  . MAMMOGRAM  Never done  . COLONOSCOPY  Never done  . DEXA SCAN  Never done  . PNA vac Low Risk Adult (1 of 2 - PCV13) 10/05/2018    There are no preventive care reminders to display for this patient.  Lab Results  Component Value Date   TSH 1.070 01/29/2020   Lab Results  Component Value Date   WBC 5.3 01/29/2020   HGB 12.3 01/29/2020   HCT 38.3 01/29/2020   MCV 87 01/29/2020   PLT 287 01/29/2020   Lab Results  Component Value Date   NA 139 01/29/2020   K 4.6 01/29/2020   CO2 22 01/29/2020   GLUCOSE 87 01/29/2020   BUN 16 01/29/2020   CREATININE 1.17 (H) 01/29/2020   BILITOT 0.4 01/29/2020   ALKPHOS 96 01/29/2020   AST 9 01/29/2020   ALT 8 01/29/2020   PROT 7.4 01/29/2020   ALBUMIN 4.2 01/29/2020   CALCIUM 12.3 (H) 01/29/2020   ANIONGAP 9 12/22/2019   Lab Results  Component Value Date   CHOL 209 (H) 01/29/2020   Lab Results  Component Value Date   HDL 69 01/29/2020   Lab Results  Component Value Date   LDLCALC 120 (H) 01/29/2020   Lab Results  Component Value Date   TRIG 116 01/29/2020   Lab Results  Component Value Date   CHOLHDL 3.0 01/29/2020   No results found for: HGBA1C    Assessment & Plan:   1. Congestive heart failure, unspecified HF chronicity, unspecified heart failure type ([place]) We will initiate Hycodan today  - HYDROcodone-homatropine (HYCODAN) 5-1.5 MG/5ML syrup; Take 5 mLs by mouth every 8 (eight) hours as needed for cough.  Dispense: 120 mL; Refill: 0 -  amoxicillin-clavulanate (AUGMENTIN) 875-125 MG tablet; Take 1 tablet by mouth 2 (two) times daily for 7 days.  Dispense: 14 tablet; Refill: 0  2. Cough - HYDROcodone-homatropine (HYCODAN) 5-1.5 MG/5ML syrup; Take 5 mLs by mouth every 8 (eight) hours as  needed for cough.  Dispense: 120 mL; Refill: 0 - amoxicillin-clavulanate (AUGMENTIN) 875-125 MG tablet; Take 1 tablet by mouth 2 (two) times daily for 7 days.  Dispense: 14 tablet; Refill: 0  3. Moderate asthma without complication, unspecified whether persistent  4. Shortness of breath Stable today. No signs or symptoms of respiratory distress noted or reported.   5. Chest congestion - HYDROcodone-homatropine (HYCODAN) 5-1.5 MG/5ML syrup; Take 5 mLs by mouth every 8 (eight) hours as needed for cough.  Dispense: 120 mL; Refill: 0 - amoxicillin-clavulanate (AUGMENTIN) 875-125 MG tablet; Take 1 tablet by mouth 2 (two) times daily for 7 days.  Dispense: 14 tablet; Refill: 0  6. Follow up She will follow up in 3 months.   Meds ordered this encounter  Medications  . HYDROcodone-homatropine (HYCODAN) 5-1.5 MG/5ML syrup    Sig: Take 5 mLs by mouth every 8 (eight) hours as needed for cough.    Dispense:  120 mL    Refill:  0  . amoxicillin-clavulanate (AUGMENTIN) 875-125 MG tablet    Sig: Take 1 tablet by mouth 2 (two) times daily for 7 days.    Dispense:  14 tablet    Refill:  0    No orders of the defined types were placed in this encounter.   Referral Orders  No referral(s) requested today   Raliegh Ip,  MSN, FNP-BC [facility] [address] [place], [place] [address] [phone] fax    Problem List Items Addressed This Visit      Cardiovascular and Mediastinum   Congestive heart failure (HCC) - Primary   Relevant Medications   HYDROcodone-homatropine (HYCODAN) 5-1.5 MG/5ML syrup   amoxicillin-clavulanate (AUGMENTIN) 875-125 MG tablet     Respiratory   Chest congestion   Relevant Medications   HYDROcodone-homatropine (HYCODAN) 5-1.5 MG/5ML syrup   amoxicillin-clavulanate (AUGMENTIN) 875-125 MG tablet   Moderate asthma without complication     Other   Shortness  of breath    Other Visit Diagnoses    Cough       Relevant Medications   HYDROcodone-homatropine (HYCODAN) 5-1.5 MG/5ML syrup   amoxicillin-clavulanate (AUGMENTIN) 875-125 MG tablet   Follow up          Meds ordered this encounter  Medications  . HYDROcodone-homatropine (HYCODAN) 5-1.5 MG/5ML syrup    Sig: Take 5 mLs by mouth every 8 (eight) hours as needed for cough.    Dispense:  120 mL    Refill:  0  . amoxicillin-clavulanate (AUGMENTIN) 875-125 MG tablet    Sig: Take 1 tablet by mouth 2 (two) times daily for 7 days.    Dispense:  14 tablet    Refill:  0    Follow-up: Return in about 1 month (around 06/26/2020).    Kallie Locks, FNP

## 2020-06-12 ENCOUNTER — Other Ambulatory Visit: Payer: Self-pay | Admitting: Family Medicine

## 2020-06-26 ENCOUNTER — Ambulatory Visit (INDEPENDENT_AMBULATORY_CARE_PROVIDER_SITE_OTHER): Payer: Medicare Other | Admitting: Family Medicine

## 2020-06-26 ENCOUNTER — Encounter: Payer: Self-pay | Admitting: Family Medicine

## 2020-06-26 VITALS — BP 116/81 | HR 73 | Temp 97.5°F | Ht 64.0 in | Wt 168.0 lb

## 2020-06-26 DIAGNOSIS — R05 Cough: Secondary | ICD-10-CM | POA: Diagnosis not present

## 2020-06-26 DIAGNOSIS — Z09 Encounter for follow-up examination after completed treatment for conditions other than malignant neoplasm: Secondary | ICD-10-CM

## 2020-06-26 DIAGNOSIS — R0989 Other specified symptoms and signs involving the circulatory and respiratory systems: Secondary | ICD-10-CM | POA: Diagnosis not present

## 2020-06-26 DIAGNOSIS — J45909 Unspecified asthma, uncomplicated: Secondary | ICD-10-CM

## 2020-06-26 DIAGNOSIS — Z0000 Encounter for general adult medical examination without abnormal findings: Secondary | ICD-10-CM

## 2020-06-26 DIAGNOSIS — R0602 Shortness of breath: Secondary | ICD-10-CM

## 2020-06-26 DIAGNOSIS — I509 Heart failure, unspecified: Secondary | ICD-10-CM | POA: Diagnosis not present

## 2020-06-26 DIAGNOSIS — R059 Cough, unspecified: Secondary | ICD-10-CM

## 2020-06-26 NOTE — Progress Notes (Signed)
[facility]    Established Patient Office Visit  Subjective:  Patient ID: Kylie Sanders, female    DOB: 29-Dec-1950  Age: 69 y.o. MRN: [id number]  CC:  Chief Complaint  Patient presents with  . Follow-up    has not recieved nebulizer; coughing up mucus. Right sided pain    HPI Kylie Sanders is a 69 year old female who presents for Follow Up today.     Patient Active Problem List   Diagnosis Date Noted  . Shortness of breath 03/01/2020  . Chest congestion 03/01/2020  . Nausea 03/01/2020  . History of 2019 novel coronavirus disease (COVID-19) 02/02/2020  . Moderate asthma without complication 02/02/2020  . Congestive heart failure (HCC) 02/02/2020  . Former cigar smoker 02/02/2020  . Acute kidney injury due to COVID-19 (HCC) 12/18/2019  . AKI (acute kidney injury) (HCC) 12/17/2019  . Pneumonia due to COVID-19 virus 12/17/2019  . Dehydration 12/17/2019    Past Medical History:  Diagnosis Date  . Asthma   . CHF (congestive heart failure) (HCC)   . Diabetes mellitus without complication (HCC)   . History of 2019 novel coronavirus disease (COVID-19) 12/12/2019  . Hyperlipidemia   . Hypertension   . Hypokalemia 12/2019  . Myocardial infarction (HCC)   . Seizures (HCC)   . Shortness of breath   . Stroke (HCC)   . Vitamin D deficiency 01/2020  . Wheezes    Current Status: Since her last office visit, she has c/o increased productive cough lately. She is requesting assistance with obtaining a nebulizer. She denies visual changes, chest pain, heart palpitations, and falls. She has occasional headaches and dizziness with position changes. Denies severe headaches, confusion, seizures, double vision, and blurred vision, nausea and vomiting. She denies fatigue, frequent urination, blurred vision, excessive hunger, excessive thirst, weight gain, weight loss, and poor wound healing. She continues to check her feet regularly. She  denies fevers, chills, fatigue, recent infections, weight loss, and night sweats. Denies GI problems such as nausea, vomiting, diarrhea, and constipation. She has no reports of blood in stools, dysuria and hematuria. No depression or anxiety, and denies suicidal ideations, homicidal ideations, or auditory hallucinations. She is taking all medications as prescribed. She denies pain today.   Past Surgical History:  Procedure Laterality Date  . PACEMAKER IMPLANT      No family history on file.  Social History   Socioeconomic History  . Marital status: Single    Spouse name: Not on file  . Number of children: Not on file  . Years of education: Not on file  . Highest education level: Not on file  Occupational History  . Not on file  Tobacco Use  . Smoking status: Former Games developer  . Smokeless tobacco: Never Used  Vaping Use  . Vaping Use: Never used  Substance and Sexual Activity  . Alcohol use: Not Currently  . Drug use: Never  . Sexual activity: Not Currently  Other Topics Concern  . Not on file  Social History Narrative  . Not on file   Social Determinants of Health   Financial Resource Strain:   . Difficulty of Paying Living Expenses:   Food Insecurity:   . Worried About Programme researcher, broadcasting/film/video in the Last Year:   . Barista in the Last Year:   Transportation Needs:   . Freight forwarder (Medical):   Marland Kitchen Lack of Transportation (Non-Medical):   Physical Activity:   .  Days of Exercise per Week:   . Minutes of Exercise per Session:   Stress:   . Feeling of Stress :   Social Connections:   . Frequency of Communication with Friends and Family:   . Frequency of Social Gatherings with Friends and Family:   . Attends Religious Services:   . Active Member of Clubs or Organizations:   . Attends Banker Meetings:   Marland Kitchen Marital Status:   Intimate Partner Violence:   . Fear of Current or Ex-Partner:   . Emotionally Abused:   Marland Kitchen Physically Abused:   . Sexually  Abused:     Outpatient Medications Prior to Visit  Medication Sig Dispense Refill  . albuterol (VENTOLIN HFA) 108 (90 Base) MCG/ACT inhaler Inhale 2 puffs into the lungs every 4 (four) hours as needed for wheezing or shortness of breath. 18 g 12  . feeding supplement, ENSURE ENLIVE, (ENSURE ENLIVE) LIQD Take 237 mLs by mouth 2 (two) times daily between meals. 237 mL 12  . fluticasone (FLONASE) 50 MCG/ACT nasal spray SHAKE LIQUID AND USE 2 SPRAYS IN EACH NOSTRIL DAILY 48 g 3  . HYDROcodone-homatropine (HYCODAN) 5-1.5 MG/5ML syrup Take 5 mLs by mouth every 8 (eight) hours as needed for cough. 120 mL 0  . losartan-hydrochlorothiazide (HYZAAR) 50-12.5 MG tablet TAKE 1 TABLET BY MOUTH DAILY 30 tablet 6  . lubiprostone (AMITIZA) 24 MCG capsule Take 1 capsule (24 mcg total) by mouth 2 (two) times daily with a meal. 60 capsule 6  . omeprazole (PRILOSEC) 20 MG capsule Take 1 capsule (20 mg total) by mouth daily. 30 capsule 6  . ondansetron (ZOFRAN) 4 MG tablet Take 1 tablet (4 mg total) by mouth every 6 (six) hours as needed for nausea. 30 tablet 3  . potassium chloride (KLOR-CON) 10 MEQ tablet Take 1 tablet (10 mEq total) by mouth 2 (two) times daily. 60 tablet 3  . sodium chloride (OCEAN) 0.65 % SOLN nasal spray Place 1 spray into both nostrils as needed for congestion. 30 mL 6  . albuterol (PROVENTIL) (2.5 MG/3ML) 0.083% nebulizer solution Take 3 mLs (2.5 mg total) by nebulization every 6 (six) hours as needed for wheezing or shortness of breath. (Patient not taking: Reported on 06/26/2020) 75 mL 12  . Nebulizer MISC 1 each by Does not apply route every 6 (six) hours as needed. (Patient not taking: Reported on 02/26/2020) 1 each 0  . tiZANidine (ZANAFLEX) 4 MG tablet Take 1-2 tablets (4-8 mg total) by mouth every 6 (six) hours as needed for muscle spasms. (Patient not taking: Reported on 02/26/2020) 60 tablet 3  . Vitamin D, Ergocalciferol, (DRISDOL) 1.25 MG (50000 UNIT) CAPS capsule Take 1 capsule (50,000  Units total) by mouth every 7 (seven) days. (Patient not taking: Reported on 02/26/2020) 5 capsule 6   No facility-administered medications prior to visit.    No Known Allergies  ROS Review of Systems  Constitutional: Negative.   HENT: Negative.   Eyes: Negative.   Respiratory: Positive for shortness of breath (occasional ).   Cardiovascular: Negative.   Gastrointestinal: Negative.   Endocrine: Negative.   Genitourinary: Negative.   Musculoskeletal: Positive for arthralgias (generalized joint pain).  Allergic/Immunologic: Negative.   Neurological: Positive for dizziness (occasional) and headaches (occasional ).  Hematological: Negative.   Psychiatric/Behavioral: Negative.       Objective:    Physical Exam Vitals and nursing note reviewed.  Constitutional:      Appearance: Normal appearance.  HENT:     Head:  Normocephalic and atraumatic.     Nose: Nose normal.     Mouth/Throat:     Mouth: Mucous membranes are moist.     Pharynx: Oropharynx is clear.  Cardiovascular:     Rate and Rhythm: Normal rate and regular rhythm.     Pulses: Normal pulses.     Heart sounds: Normal heart sounds.  Pulmonary:     Effort: Pulmonary effort is normal.     Breath sounds: Wheezing (bilateral lung lobes) present.  Abdominal:     General: Bowel sounds are normal.     Palpations: Abdomen is soft.  Musculoskeletal:        General: Normal range of motion.     Cervical back: Normal range of motion and neck supple.  Skin:    General: Skin is warm and dry.  Neurological:     General: No focal deficit present.     Mental Status: She is alert and oriented to person, place, and time.  Psychiatric:        Mood and Affect: Mood normal.        Thought Content: Thought content normal.        Judgment: Judgment normal.     BP 116/81 (BP Location: Left Arm, Patient Position: Sitting, Cuff Size: Small)   Pulse 73   Temp (!) 97.5 F (36.4 C)   Ht 5\' 4"  (1.626 m)   Wt 168 lb 0.6 oz (76.2 kg)    SpO2 100%   BMI 28.84 kg/m  Wt Readings from Last 3 Encounters:  06/26/20 168 lb 0.6 oz (76.2 kg)  05/27/20 169 lb (76.7 kg)  02/26/20 167 lb 6.4 oz (75.9 kg)     Health Maintenance Due  Topic Date Due  . Hepatitis C Screening  Never done  . COVID-19 Vaccine (1) Never done  . TETANUS/TDAP  Never done  . MAMMOGRAM  Never done  . COLONOSCOPY  Never done  . DEXA SCAN  Never done  . PNA vac Low Risk Adult (1 of 2 - PCV13) 10/05/2018    There are no preventive care reminders to display for this patient.  Lab Results  Component Value Date   TSH 1.070 01/29/2020   Lab Results  Component Value Date   WBC 5.3 01/29/2020   HGB 12.3 01/29/2020   HCT 38.3 01/29/2020   MCV 87 01/29/2020   PLT 287 01/29/2020   Lab Results  Component Value Date   NA 139 01/29/2020   K 4.6 01/29/2020   CO2 22 01/29/2020   GLUCOSE 87 01/29/2020   BUN 16 01/29/2020   CREATININE 1.17 (H) 01/29/2020   BILITOT 0.4 01/29/2020   ALKPHOS 96 01/29/2020   AST 9 01/29/2020   ALT 8 01/29/2020   PROT 7.4 01/29/2020   ALBUMIN 4.2 01/29/2020   CALCIUM 12.3 (H) 01/29/2020   ANIONGAP 9 12/22/2019   Lab Results  Component Value Date   CHOL 209 (H) 01/29/2020   Lab Results  Component Value Date   HDL 69 01/29/2020   Lab Results  Component Value Date   LDLCALC 120 (H) 01/29/2020   Lab Results  Component Value Date   TRIG 116 01/29/2020   Lab Results  Component Value Date   CHOLHDL 3.0 01/29/2020   No results found for: HGBA1C    Assessment & Plan:   1. Health care maintenance  2. Congestive heart failure, unspecified HF chronicity, unspecified heart failure type (HCC)  3. Cough  4. Moderate asthma without complication, unspecified whether  persistent Wheezes. Stable. No signs or symptoms of respiratory distress noted or reported today.   5. Chest congestion We will order nebulizer machine and supplies today.   6. Shortness of breath Stable.   7. Follow up She will follow up  in 3 months.   No orders of the defined types were placed in this encounter.   No orders of the defined types were placed in this encounter.   Referral Orders  No referral(s) requested today    Raliegh Ip,  MSN, FNP-BC [facility] [address] [place], [place] [address] [phone] fax   Problem List Items Addressed This Visit      Cardiovascular and Mediastinum   Congestive heart failure [facility])     Respiratory   Chest congestion   Moderate asthma without complication     Other   Shortness of breath    Other Visit Diagnoses    Health care maintenance    -  Primary   Cough       Follow up          No orders of the defined types were placed in this encounter.   Follow-up: Return in about 3 months (around 09/26/2020).    Kallie Locks, FNP

## 2020-06-29 ENCOUNTER — Encounter: Payer: Self-pay | Admitting: Family Medicine

## 2020-07-01 ENCOUNTER — Telehealth: Payer: Self-pay | Admitting: Family Medicine

## 2020-07-02 NOTE — Telephone Encounter (Signed)
Done

## 2020-07-14 ENCOUNTER — Telehealth: Payer: Self-pay | Admitting: Family Medicine

## 2020-07-14 NOTE — Telephone Encounter (Signed)
error 

## 2020-08-26 ENCOUNTER — Telehealth (INDEPENDENT_AMBULATORY_CARE_PROVIDER_SITE_OTHER): Payer: Medicare Other | Admitting: Family Medicine

## 2020-08-26 ENCOUNTER — Other Ambulatory Visit: Payer: Self-pay

## 2020-08-26 DIAGNOSIS — R05 Cough: Secondary | ICD-10-CM

## 2020-08-26 DIAGNOSIS — R059 Cough, unspecified: Secondary | ICD-10-CM

## 2020-08-26 DIAGNOSIS — R0602 Shortness of breath: Secondary | ICD-10-CM | POA: Diagnosis not present

## 2020-08-26 DIAGNOSIS — R0989 Other specified symptoms and signs involving the circulatory and respiratory systems: Secondary | ICD-10-CM

## 2020-08-26 DIAGNOSIS — I509 Heart failure, unspecified: Secondary | ICD-10-CM

## 2020-08-26 DIAGNOSIS — J45909 Unspecified asthma, uncomplicated: Secondary | ICD-10-CM | POA: Diagnosis not present

## 2020-08-26 DIAGNOSIS — R110 Nausea: Secondary | ICD-10-CM

## 2020-08-26 DIAGNOSIS — Z09 Encounter for follow-up examination after completed treatment for conditions other than malignant neoplasm: Secondary | ICD-10-CM

## 2020-08-26 MED ORDER — ACETAMINOPHEN 500 MG PO TABS: 1000.0000 mg | ORAL_TABLET | Freq: Two times a day (BID) | ORAL | 6 refills | Status: DC | PRN

## 2020-08-26 MED ORDER — LUBIPROSTONE 24 MCG PO CAPS
24.0000 ug | ORAL_CAPSULE | Freq: Two times a day (BID) | ORAL | 11 refills | Status: DC
Start: 2020-08-26 — End: 2021-09-06

## 2020-08-26 MED ORDER — ONDANSETRON HCL 4 MG PO TABS: 4.0000 mg | ORAL_TABLET | Freq: Four times a day (QID) | ORAL | 3 refills | Status: DC | PRN

## 2020-08-26 MED ORDER — LOSARTAN POTASSIUM-HCTZ 50-12.5 MG PO TABS
1.0000 | ORAL_TABLET | Freq: Every day | ORAL | 11 refills | Status: DC
Start: 2020-08-26 — End: 2021-01-22

## 2020-08-26 MED ORDER — TIZANIDINE HCL 4 MG PO TABS: 4.0000 mg | ORAL_TABLET | Freq: Four times a day (QID) | ORAL | 3 refills | Status: DC | PRN

## 2020-08-26 MED ORDER — ALBUTEROL SULFATE HFA 108 (90 BASE) MCG/ACT IN AERS: 2.0000 | INHALATION_SPRAY | RESPIRATORY_TRACT | 11 refills | Status: DC | PRN

## 2020-08-26 MED ORDER — HYDROCODONE-HOMATROPINE 5-1.5 MG/5ML PO SYRP: 5.0000 mL | ORAL_SOLUTION | Freq: Three times a day (TID) | ORAL | 0 refills | Status: DC | PRN

## 2020-08-26 MED ORDER — ALBUTEROL SULFATE (2.5 MG/3ML) 0.083% IN NEBU: 2.5000 mg | INHALATION_SOLUTION | Freq: Four times a day (QID) | RESPIRATORY_TRACT | 11 refills | Status: DC | PRN

## 2020-08-26 MED ORDER — SALINE SPRAY 0.65 % NA SOLN: 1.0000 | NASAL | 11 refills | Status: DC | PRN

## 2020-08-26 MED ORDER — OMEPRAZOLE 20 MG PO CPDR
20.0000 mg | DELAYED_RELEASE_CAPSULE | Freq: Every day | ORAL | 6 refills | Status: DC
Start: 2020-08-26 — End: 2021-09-06

## 2020-08-26 MED ORDER — BUDESONIDE-FORMOTEROL FUMARATE 80-4.5 MCG/ACT IN AERO: 2.0000 | INHALATION_SPRAY | Freq: Two times a day (BID) | RESPIRATORY_TRACT | 11 refills | Status: DC

## 2020-08-26 MED ORDER — FLUTICASONE PROPIONATE 50 MCG/ACT NA SUSP
NASAL | 3 refills | Status: DC
Start: 2020-08-26 — End: 2021-03-24

## 2020-08-26 NOTE — Progress Notes (Signed)
Virtual Visit via Telephone Note  I connected with Kylie Sanders on 08/28/20 at  9:35 AM EDT by telephone and verified that I am speaking with the correct person using two identifiers.   I discussed the limitations, risks, security and privacy concerns of performing an evaluation and management service by telephone and the availability of in person appointments. I also discussed with the patient that there may be a patient responsible charge related to this service. The patient expressed understanding and agreed to proceed.  Televisit Today Patient Location: Home Provider Location: Office   History of Present Illness:  Past Surgical History:  Procedure Laterality Date  . PACEMAKER IMPLANT      Social History   Socioeconomic History  . Marital status: Single    Spouse name: Not on file  . Number of children: Not on file  . Years of education: Not on file  . Highest education level: Not on file  Occupational History  . Not on file  Tobacco Use  . Smoking status: Former Games developer  . Smokeless tobacco: Never Used  Vaping Use  . Vaping Use: Never used  Substance and Sexual Activity  . Alcohol use: Not Currently  . Drug use: Never  . Sexual activity: Not Currently  Other Topics Concern  . Not on file  Social History Narrative  . Not on file   Social Determinants of Health   Financial Resource Strain:   . Difficulty of Paying Living Expenses: Not on file  Food Insecurity:   . Worried About Programme researcher, broadcasting/film/video in the Last Year: Not on file  . Ran Out of Food in the Last Year: Not on file  Transportation Needs:   . Lack of Transportation (Medical): Not on file  . Lack of Transportation (Non-Medical): Not on file  Physical Activity:   . Days of Exercise per Week: Not on file  . Minutes of Exercise per Session: Not on file  Stress:   . Feeling of Stress : Not on file  Social Connections:   . Frequency of Communication with Friends and Family: Not on file  . Frequency of  Social Gatherings with Friends and Family: Not on file  . Attends Religious Services: Not on file  . Active Member of Clubs or Organizations: Not on file  . Attends Banker Meetings: Not on file  . Marital Status: Not on file  Intimate Partner Violence:   . Fear of Current or Ex-Partner: Not on file  . Emotionally Abused: Not on file  . Physically Abused: Not on file  . Sexually Abused: Not on file    No family history on file.  Past Medical History:  Diagnosis Date  . Asthma   . CHF (congestive heart failure) (HCC)   . Diabetes mellitus without complication (HCC)   . History of 2019 novel coronavirus disease (COVID-19) 12/12/2019  . Hyperlipidemia   . Hypertension   . Hypokalemia 12/2019  . Myocardial infarction (HCC)   . Seizures (HCC)   . Shortness of breath   . Stroke (HCC)   . Vitamin D deficiency 01/2020  . Wheezes     Current Outpatient Medications on File Prior to Visit  Medication Sig Dispense Refill  . Nebulizer MISC 1 each by Does not apply route every 6 (six) hours as needed. 1 each 0  . feeding supplement, ENSURE ENLIVE, (ENSURE ENLIVE) LIQD Take 237 mLs by mouth 2 (two) times daily between meals. (Patient not taking: Reported on 08/26/2020) 237 mL  12  . potassium chloride (KLOR-CON) 10 MEQ tablet Take 1 tablet (10 mEq total) by mouth 2 (two) times daily. (Patient not taking: Reported on 08/26/2020) 60 tablet 3  . Vitamin D, Ergocalciferol, (DRISDOL) 1.25 MG (50000 UNIT) CAPS capsule Take 1 capsule (50,000 Units total) by mouth every 7 (seven) days. (Patient not taking: Reported on 02/26/2020) 5 capsule 6   No current facility-administered medications on file prior to visit.     No Known Allergies    Current Status: Since her last office visit, she has c/o of productive cough and frequent cough X 2 weeks now. She states that she has been using her Albuterol inhaler and nebulizers daily. No chest pain, heart palpitations, and shortness of breath  reported. Her anxiety is moderated today r/t ongoing cough. She denies fevers, chills, fatigue, recent infections, weight loss, and night sweats. She has not had any headaches, visual changes, dizziness, and falls. Denies GI problems such as nausea, vomiting, and diarrhea. She has no reports of blood in stools, dysuria and hematuria. She is taking all medications as prescribed. She reports generalized joint pain.     Observations/Objective:  Telephone Virtual Visit   Assessment and Plan:  1. Cough - albuterol (PROVENTIL) (2.5 MG/3ML) 0.083% nebulizer solution; Take 3 mLs (2.5 mg total) by nebulization every 6 (six) hours as needed for wheezing or shortness of breath.  Dispense: 75 mL; Refill: 11 - HYDROcodone-homatropine (HYCODAN) 5-1.5 MG/5ML syrup; Take 5 mLs by mouth every 8 (eight) hours as needed for cough.  Dispense: 120 mL; Refill: 0  2. Shortness of breath - albuterol (PROVENTIL) (2.5 MG/3ML) 0.083% nebulizer solution; Take 3 mLs (2.5 mg total) by nebulization every 6 (six) hours as needed for wheezing or shortness of breath.  Dispense: 75 mL; Refill: 11  3. Moderate asthma without complication, unspecified whether persistent - albuterol (PROVENTIL) (2.5 MG/3ML) 0.083% nebulizer solution; Take 3 mLs (2.5 mg total) by nebulization every 6 (six) hours as needed for wheezing or shortness of breath.  Dispense: 75 mL; Refill: 11  4. Congestive heart failure, unspecified HF chronicity, unspecified heart failure type (HCC) Stable. No signs or symptoms of respiratory distress noted or reported.  - albuterol (PROVENTIL) (2.5 MG/3ML) 0.083% nebulizer solution; Take 3 mLs (2.5 mg total) by nebulization every 6 (six) hours as needed for wheezing or shortness of breath.  Dispense: 75 mL; Refill: 11 - HYDROcodone-homatropine (HYCODAN) 5-1.5 MG/5ML syrup; Take 5 mLs by mouth every 8 (eight) hours as needed for cough.  Dispense: 120 mL; Refill: 0  5. Chest congestion - HYDROcodone-homatropine  (HYCODAN) 5-1.5 MG/5ML syrup; Take 5 mLs by mouth every 8 (eight) hours as needed for cough.  Dispense: 120 mL; Refill: 0  6. Nausea - ondansetron (ZOFRAN) 4 MG tablet; Take 1 tablet (4 mg total) by mouth every 6 (six) hours as needed for nausea.  Dispense: 30 tablet; Refill: 3  7. Follow up She will follow up in 1 month.   Meds ordered this encounter  Medications  . albuterol (PROVENTIL) (2.5 MG/3ML) 0.083% nebulizer solution    Sig: Take 3 mLs (2.5 mg total) by nebulization every 6 (six) hours as needed for wheezing or shortness of breath.    Dispense:  75 mL    Refill:  11  . albuterol (VENTOLIN HFA) 108 (90 Base) MCG/ACT inhaler    Sig: Inhale 2 puffs into the lungs every 4 (four) hours as needed for wheezing or shortness of breath.    Dispense:  18 g  Refill:  11  . omeprazole (PRILOSEC) 20 MG capsule    Sig: Take 1 capsule (20 mg total) by mouth daily.    Dispense:  90 capsule    Refill:  6  . fluticasone (FLONASE) 50 MCG/ACT nasal spray    Sig: SHAKE LIQUID AND USE 2 SPRAYS IN EACH NOSTRIL DAILY    Dispense:  48 g    Refill:  3    **Patient requests 90 days supply**  . HYDROcodone-homatropine (HYCODAN) 5-1.5 MG/5ML syrup    Sig: Take 5 mLs by mouth every 8 (eight) hours as needed for cough.    Dispense:  120 mL    Refill:  0    Order Specific Question:   Supervising Provider    Answer:   Quentin Angst [id number]  . losartan-hydrochlorothiazide (HYZAAR) 50-12.5 MG tablet    Sig: Take 1 tablet by mouth daily.    Dispense:  30 tablet    Refill:  11  . lubiprostone (AMITIZA) 24 MCG capsule    Sig: Take 1 capsule (24 mcg total) by mouth 2 (two) times daily with a meal.    Dispense:  60 capsule    Refill:  11  . ondansetron (ZOFRAN) 4 MG tablet    Sig: Take 1 tablet (4 mg total) by mouth every 6 (six) hours as needed for nausea.    Dispense:  30 tablet    Refill:  3  . sodium chloride (OCEAN) 0.65 % SOLN nasal spray    Sig: Place 1 spray into both nostrils as  needed for congestion.    Dispense:  30 mL    Refill:  11  . tiZANidine (ZANAFLEX) 4 MG tablet    Sig: Take 1-2 tablets (4-8 mg total) by mouth every 6 (six) hours as needed for muscle spasms.    Dispense:  60 tablet    Refill:  3  . budesonide-formoterol (SYMBICORT) 80-4.5 MCG/ACT inhaler    Sig: Inhale 2 puffs into the lungs 2 (two) times daily.    Dispense:  1 each    Refill:  11  . acetaminophen (TYLENOL) 500 MG tablet    Sig: Take 2 tablets (1,000 mg total) by mouth 2 (two) times daily as needed.    Dispense:  120 tablet    Refill:  6    No orders of the defined types were placed in this encounter.   Referral Orders  No referral(s) requested today    Raliegh Ip,  MSN, FNP-BC [facility] [address] [place], [place] [address] [phone]- fax   I discussed the assessment and treatment plan with the patient. The patient was provided an opportunity to ask questions and all were answered. The patient agreed with the plan and demonstrated an understanding of the instructions.   The patient was advised to call back or seek an in-person evaluation if the symptoms worsen or if the condition fails to improve as anticipated.  I provided 20 minutes of non-face-to-face time during this encounter.   Kallie Locks, FNP

## 2020-08-28 ENCOUNTER — Encounter: Payer: Self-pay | Admitting: Family Medicine

## 2020-08-31 ENCOUNTER — Other Ambulatory Visit: Payer: Self-pay | Admitting: Family Medicine

## 2020-08-31 DIAGNOSIS — R0602 Shortness of breath: Secondary | ICD-10-CM

## 2020-08-31 DIAGNOSIS — J45909 Unspecified asthma, uncomplicated: Secondary | ICD-10-CM

## 2020-08-31 MED ORDER — FLUTICASONE-SALMETEROL 100-50 MCG/DOSE IN AEPB: 1.0000 | INHALATION_SPRAY | Freq: Two times a day (BID) | RESPIRATORY_TRACT | 11 refills | Status: AC

## 2020-09-28 ENCOUNTER — Encounter: Payer: Self-pay | Admitting: Family Medicine

## 2020-09-28 ENCOUNTER — Ambulatory Visit (INDEPENDENT_AMBULATORY_CARE_PROVIDER_SITE_OTHER): Payer: Medicare Other | Admitting: Family Medicine

## 2020-09-28 ENCOUNTER — Other Ambulatory Visit: Payer: Self-pay

## 2020-09-28 VITALS — BP 104/63 | HR 64 | Temp 98.7°F | Ht 64.0 in | Wt 165.4 lb

## 2020-09-28 DIAGNOSIS — H547 Unspecified visual loss: Secondary | ICD-10-CM

## 2020-09-28 DIAGNOSIS — Z09 Encounter for follow-up examination after completed treatment for conditions other than malignant neoplasm: Secondary | ICD-10-CM

## 2020-09-28 DIAGNOSIS — R059 Cough, unspecified: Secondary | ICD-10-CM | POA: Diagnosis not present

## 2020-09-28 DIAGNOSIS — R0602 Shortness of breath: Secondary | ICD-10-CM

## 2020-09-28 DIAGNOSIS — J45909 Unspecified asthma, uncomplicated: Secondary | ICD-10-CM | POA: Diagnosis not present

## 2020-09-28 DIAGNOSIS — N393 Stress incontinence (female) (male): Secondary | ICD-10-CM

## 2020-09-28 DIAGNOSIS — R110 Nausea: Secondary | ICD-10-CM

## 2020-09-28 DIAGNOSIS — I509 Heart failure, unspecified: Secondary | ICD-10-CM

## 2020-09-28 MED ORDER — MONTELUKAST SODIUM 10 MG PO TABS: 10.0000 mg | ORAL_TABLET | Freq: Every day | ORAL | 11 refills | Status: DC

## 2020-09-28 MED ORDER — BENZONATATE 100 MG PO CAPS: 100.0000 mg | ORAL_CAPSULE | Freq: Two times a day (BID) | ORAL | 0 refills | Status: DC | PRN

## 2020-09-28 NOTE — Progress Notes (Signed)
[facility]   Established Patient Office Visit  Subjective:  Patient ID: Kylie Sanders, female    DOB: 07/15/1951  Age: 69 y.o. MRN: [id number]  CC:  Chief Complaint  Patient presents with  . Follow-up    Pt states she has alot of thick white sliva more than normal. X2wks. Pt also states her balance is off. X3wks.  . Back Pain    X1-2 months.    HPI Kylie SlipperShirley Sanders is a 69 year old female who presents for Follow Up today.     Patient Active Problem List   Diagnosis Date Noted  . Shortness of breath 03/01/2020  . Chest congestion 03/01/2020  . Nausea 03/01/2020  . History of 2019 novel coronavirus disease (COVID-19) 02/02/2020  . Moderate asthma without complication 02/02/2020  . Congestive heart failure (HCC) 02/02/2020  . Former cigar smoker 02/02/2020  . Acute kidney injury due to COVID-19 (HCC) 12/18/2019  . AKI (acute kidney injury) (HCC) 12/17/2019  . Pneumonia due to COVID-19 virus 12/17/2019  . Dehydration 12/17/2019   Current Status: Since her last office visit, she is doing well with no complaints. She continues to use inhalers and nebulizer as needed for cough and shortness of breath. She reports occasional incontinence, which she states that she wears briefs often. She denies fevers, chills, fatigue, recent infections, weight loss, and night sweats. She has not had any headaches, visual changes, dizziness, and falls. No chest pain, heart palpitations reported. Denies GI problems such as nausea, vomiting, diarrhea, and constipation. She has no reports of blood in stools, dysuria and hematuria. No depression or anxiety reported today. She is taking all medications as prescribed. She denies pain today.   Past Medical History:  Diagnosis Date  . Asthma   . CHF (congestive heart failure) (HCC)   . Diabetes mellitus without complication (HCC)   . History of 2019 novel coronavirus disease (COVID-19) 12/12/2019  .  Hyperlipidemia   . Hypertension   . Hypokalemia 12/2019  . Myocardial infarction (HCC)   . Seizures (HCC)   . Shortness of breath   . Stroke (HCC)   . Vitamin D deficiency 01/2020  . Wheezes     Past Surgical History:  Procedure Laterality Date  . PACEMAKER IMPLANT      No family history on file.  Social History   Socioeconomic History  . Marital status: Single    Spouse name: Not on file  . Number of children: Not on file  . Years of education: Not on file  . Highest education level: Not on file  Occupational History  . Not on file  Tobacco Use  . Smoking status: Former Games developermoker  . Smokeless tobacco: Never Used  Vaping Use  . Vaping Use: Never used  Substance and Sexual Activity  . Alcohol use: Not Currently  . Drug use: Never  . Sexual activity: Not Currently  Other Topics Concern  . Not on file  Social History Narrative  . Not on file   Social Determinants of Health   Financial Resource Strain:   . Difficulty of Paying Living Expenses: Not on file  Food Insecurity:   . Worried About Programme researcher, broadcasting/film/videounning Out of Food in the Last Year: Not on file  . Ran Out of Food in the Last Year: Not on file  Transportation Needs:   . Lack of Transportation (Medical): Not on file  . Lack of Transportation (Non-Medical): Not on file  Physical Activity:   .  Days of Exercise per Week: Not on file  . Minutes of Exercise per Session: Not on file  Stress:   . Feeling of Stress : Not on file  Social Connections:   . Frequency of Communication with Friends and Family: Not on file  . Frequency of Social Gatherings with Friends and Family: Not on file  . Attends Religious Services: Not on file  . Active Member of Clubs or Organizations: Not on file  . Attends Banker Meetings: Not on file  . Marital Status: Not on file  Intimate Partner Violence:   . Fear of Current or Ex-Partner: Not on file  . Emotionally Abused: Not on file  . Physically Abused: Not on file  . Sexually  Abused: Not on file    Outpatient Medications Prior to Visit  Medication Sig Dispense Refill  . acetaminophen (TYLENOL) 500 MG tablet Take 2 tablets (1,000 mg total) by mouth 2 (two) times daily as needed. 120 tablet 6  . albuterol (PROVENTIL) (2.5 MG/3ML) 0.083% nebulizer solution Take 3 mLs (2.5 mg total) by nebulization every 6 (six) hours as needed for wheezing or shortness of breath. 75 mL 11  . albuterol (VENTOLIN HFA) 108 (90 Base) MCG/ACT inhaler Inhale 2 puffs into the lungs every 4 (four) hours as needed for wheezing or shortness of breath. 18 g 11  . feeding supplement, ENSURE ENLIVE, (ENSURE ENLIVE) LIQD Take 237 mLs by mouth 2 (two) times daily between meals. 237 mL 12  . fluticasone (FLONASE) 50 MCG/ACT nasal spray SHAKE LIQUID AND USE 2 SPRAYS IN EACH NOSTRIL DAILY 48 g 3  . Fluticasone-Salmeterol (ADVAIR) 100-50 MCG/DOSE AEPB Inhale 1 puff into the lungs 2 (two) times daily. 1 each 11  . losartan-hydrochlorothiazide (HYZAAR) 50-12.5 MG tablet Take 1 tablet by mouth daily. 30 tablet 11  . lubiprostone (AMITIZA) 24 MCG capsule Take 1 capsule (24 mcg total) by mouth 2 (two) times daily with a meal. 60 capsule 11  . Nebulizer MISC 1 each by Does not apply route every 6 (six) hours as needed. 1 each 0  . omeprazole (PRILOSEC) 20 MG capsule Take 1 capsule (20 mg total) by mouth daily. 90 capsule 6  . tiZANidine (ZANAFLEX) 4 MG tablet Take 1-2 tablets (4-8 mg total) by mouth every 6 (six) hours as needed for muscle spasms. 60 tablet 3  . HYDROcodone-homatropine (HYCODAN) 5-1.5 MG/5ML syrup Take 5 mLs by mouth every 8 (eight) hours as needed for cough. (Patient not taking: Reported on 09/28/2020) 120 mL 0  . ondansetron (ZOFRAN) 4 MG tablet Take 1 tablet (4 mg total) by mouth every 6 (six) hours as needed for nausea. (Patient not taking: Reported on 09/28/2020) 30 tablet 3  . potassium chloride (KLOR-CON) 10 MEQ tablet Take 1 tablet (10 mEq total) by mouth 2 (two) times daily. (Patient not  taking: Reported on 08/26/2020) 60 tablet 3  . sodium chloride (OCEAN) 0.65 % SOLN nasal spray Place 1 spray into both nostrils as needed for congestion. (Patient not taking: Reported on 09/28/2020) 30 mL 11  . Vitamin D, Ergocalciferol, (DRISDOL) 1.25 MG (50000 UNIT) CAPS capsule Take 1 capsule (50,000 Units total) by mouth every 7 (seven) days. (Patient not taking: Reported on 02/26/2020) 5 capsule 6   No facility-administered medications prior to visit.    No Known Allergies  ROS Review of Systems  Constitutional: Negative.   HENT: Negative.   Eyes: Positive for visual disturbance.  Respiratory: Positive for cough (occasional ) and shortness of breath (  occasional ).   Cardiovascular: Negative.   Gastrointestinal: Negative.   Endocrine: Negative.   Genitourinary: Negative.   Musculoskeletal: Positive for arthralgias (generalized joint pain.).  Skin: Negative.   Allergic/Immunologic: Negative.   Neurological: Positive for dizziness (occasional ) and headaches (occasional ).  Hematological: Negative.   Psychiatric/Behavioral: Negative.     Objective:    Physical Exam Vitals and nursing note reviewed.  Constitutional:      Appearance: Normal appearance.  HENT:     Head: Normocephalic and atraumatic.     Nose: Nose normal.     Mouth/Throat:     Mouth: Mucous membranes are moist.     Pharynx: Oropharynx is clear.  Cardiovascular:     Rate and Rhythm: Normal rate and regular rhythm.     Pulses: Normal pulses.     Heart sounds: Normal heart sounds.  Pulmonary:     Effort: Pulmonary effort is normal.     Breath sounds: Normal breath sounds.  Abdominal:     General: Bowel sounds are normal.     Palpations: Abdomen is soft.  Musculoskeletal:     Cervical back: Normal range of motion and neck supple.     Comments: Limited ROM in spine  Skin:    General: Skin is warm and dry.  Neurological:     General: No focal deficit present.     Mental Status: She is alert and oriented  to person, place, and time.  Psychiatric:        Mood and Affect: Mood normal.        Behavior: Behavior normal.        Thought Content: Thought content normal.        Judgment: Judgment normal.     BP 104/63 (BP Location: Left Arm, Patient Position: Sitting, Cuff Size: Normal)   Pulse 64   Temp 98.7 F (37.1 C)   Ht 5\' 4"  (1.626 m)   Wt 165 lb 6.4 oz (75 kg)   SpO2 100%   BMI 28.39 kg/m  Wt Readings from Last 3 Encounters:  09/28/20 165 lb 6.4 oz (75 kg)  06/26/20 168 lb 0.6 oz (76.2 kg)  05/27/20 169 lb (76.7 kg)     Health Maintenance Due  Topic Date Due  . Hepatitis C Screening  Never done  . COVID-19 Vaccine (1) Never done  . TETANUS/TDAP  Never done  . MAMMOGRAM  Never done  . COLONOSCOPY  Never done  . DEXA SCAN  Never done  . PNA vac Low Risk Adult (1 of 2 - PCV13) 10/05/2018    There are no preventive care reminders to display for this patient.  Lab Results  Component Value Date   TSH 1.070 01/29/2020   Lab Results  Component Value Date   WBC 5.3 01/29/2020   HGB 12.3 01/29/2020   HCT 38.3 01/29/2020   MCV 87 01/29/2020   PLT 287 01/29/2020   Lab Results  Component Value Date   NA 139 01/29/2020   K 4.6 01/29/2020   CO2 22 01/29/2020   GLUCOSE 87 01/29/2020   BUN 16 01/29/2020   CREATININE 1.17 (H) 01/29/2020   BILITOT 0.4 01/29/2020   ALKPHOS 96 01/29/2020   AST 9 01/29/2020   ALT 8 01/29/2020   PROT 7.4 01/29/2020   ALBUMIN 4.2 01/29/2020   CALCIUM 12.3 (H) 01/29/2020   ANIONGAP 9 12/22/2019   Lab Results  Component Value Date   CHOL 209 (H) 01/29/2020   Lab Results  Component Value  Date   HDL 69 01/29/2020   Lab Results  Component Value Date   LDLCALC 120 (H) 01/29/2020   Lab Results  Component Value Date   TRIG 116 01/29/2020   Lab Results  Component Value Date   CHOLHDL 3.0 01/29/2020   No results found for: HGBA1C    Assessment & Plan:   1. Moderate asthma without complication, unspecified whether  persistent  2. Congestive heart failure, unspecified HF chronicity, unspecified heart failure type (HCC)  3. Shortness of breath Stable. No signs or symptoms of respiratory distress noted or reported.  4. Cough - montelukast (SINGULAIR) 10 MG tablet; Take 1 tablet (10 mg total) by mouth at bedtime.  Dispense: 30 tablet; Refill: 11 - benzonatate (TESSALON) 100 MG capsule; Take 1 capsule (100 mg total) by mouth 2 (two) times daily as needed for cough.  Dispense: 20 capsule; Refill: 0  5. Nausea  6. Vision problems  7. Stress incontinence  8. Follow up She will follow up in 4 months for office visit and labs.   Meds ordered this encounter  Medications  . montelukast (SINGULAIR) 10 MG tablet    Sig: Take 1 tablet (10 mg total) by mouth at bedtime.    Dispense:  30 tablet    Refill:  11  . benzonatate (TESSALON) 100 MG capsule    Sig: Take 1 capsule (100 mg total) by mouth 2 (two) times daily as needed for cough.    Dispense:  20 capsule    Refill:  0   No orders of the defined types were placed in this encounter.   Referral Orders  No referral(s) requested today    Raliegh Ip,  MSN, FNP-BC [facility]/Internal Medicine/Sickle Cell Center [facility] [address] [place], [place] [address] [phone] fax  Problem List Items Addressed This Visit      Cardiovascular and Mediastinum   Congestive heart failure (HCC)     Respiratory   Moderate asthma without complication - Primary   Relevant Medications   montelukast (SINGULAIR) 10 MG tablet     Other   Nausea   Shortness of breath    Other Visit Diagnoses    Cough       Relevant Medications   montelukast (SINGULAIR) 10 MG tablet   benzonatate (TESSALON) 100 MG capsule   Vision problems       Stress incontinence       Follow up          Meds ordered this encounter  Medications  . montelukast (SINGULAIR) 10 MG tablet    Sig: Take 1 tablet (10 mg  total) by mouth at bedtime.    Dispense:  30 tablet    Refill:  11  . benzonatate (TESSALON) 100 MG capsule    Sig: Take 1 capsule (100 mg total) by mouth 2 (two) times daily as needed for cough.    Dispense:  20 capsule    Refill:  0    Follow-up: No follow-ups on file.    Kallie Locks, FNP

## 2020-09-30 ENCOUNTER — Encounter: Payer: Self-pay | Admitting: Family Medicine

## 2020-11-26 ENCOUNTER — Telehealth: Payer: Self-pay

## 2020-11-26 NOTE — Telephone Encounter (Signed)
Called and spoke w/patient  to inform her that her insurance will no longer covers Symbicort , that this was change to Advair in Sept. I told pt that she has enough refill at [facility] to get her medication refilled. Pt questioned the cost of this medication I told her that she will need to speak with pharmacy to get the cost, however advised the patient that she is no longer using  Symbicort . The only two inhaler she using is the is Advair and Ventolin and she has refill on both inhaler.

## 2020-12-07 IMAGING — DX DG CHEST 2V
2 series · 2 of 2 positions shown · non-contrast
Comparison: None.

CLINICAL DATA: Pt states she has been experiencing neck pain, leg
pain, and back pain with difficulty breathing x [REDACTED]. Pt is not a
smoker. Pt has hx of hypertension and diabetes. Denies previous
chest/lung injury. Pt does have a pacemaker. OXENDINE

EXAM:
CHEST - 2 VIEW

[chest pa]
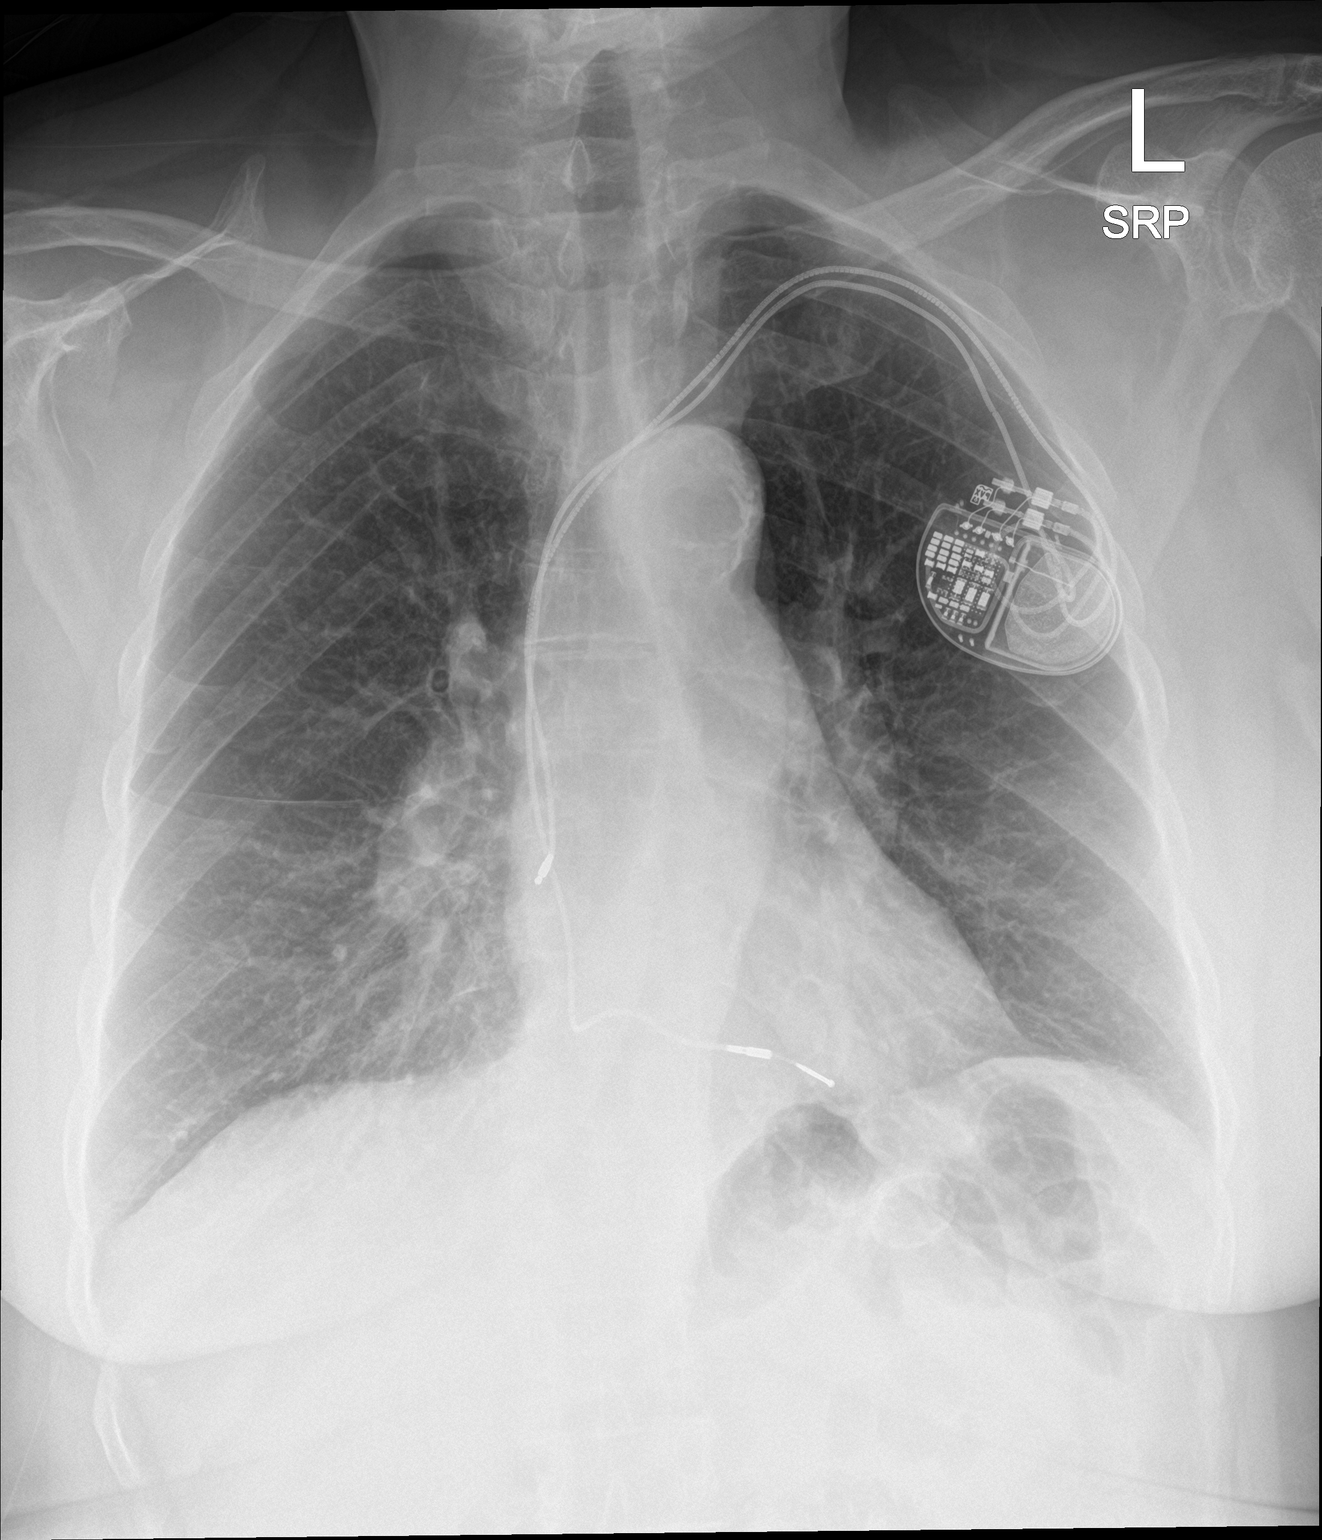

[chest lat]
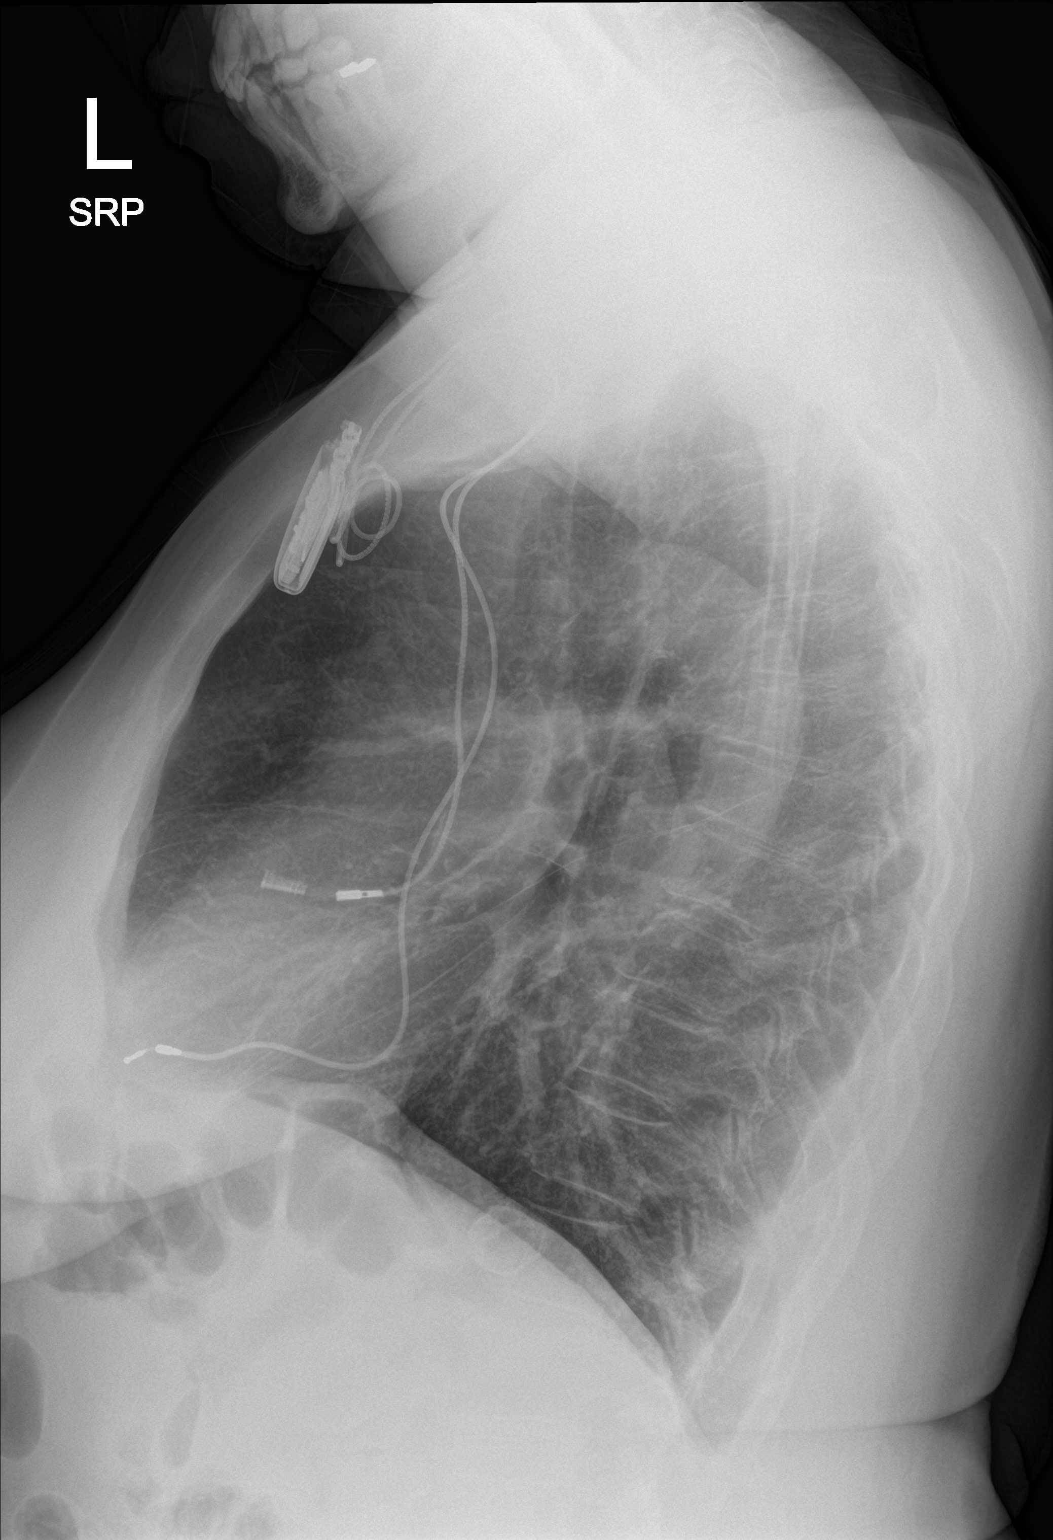

[2 of 2 positions shown; findings below may reference images not displayed]

FINDINGS: Cardiac silhouette is normal in size and configuration. Left
anterior chest wall sequential pacemaker is well positioned, leads
projecting in the right atrium and right ventricle. No mediastinal
or hilar masses or evidence of adenopathy. The pulmonary arteries
are prominent.

Lungs are mildly hyperexpanded, but clear. No pleural effusion or
pneumothorax.

Skeletal structures are intact.
IMPRESSION: No active cardiopulmonary disease.

## 2020-12-12 IMAGING — DX DG CHEST 1V PORT
1 series · 1 of 1 positions shown · non-contrast
Comparison: 12/12/2019.

CLINICAL DATA: Shortness of breath.  Cough.

EXAM:
PORTABLE CHEST 1 VIEW

[chest ap]
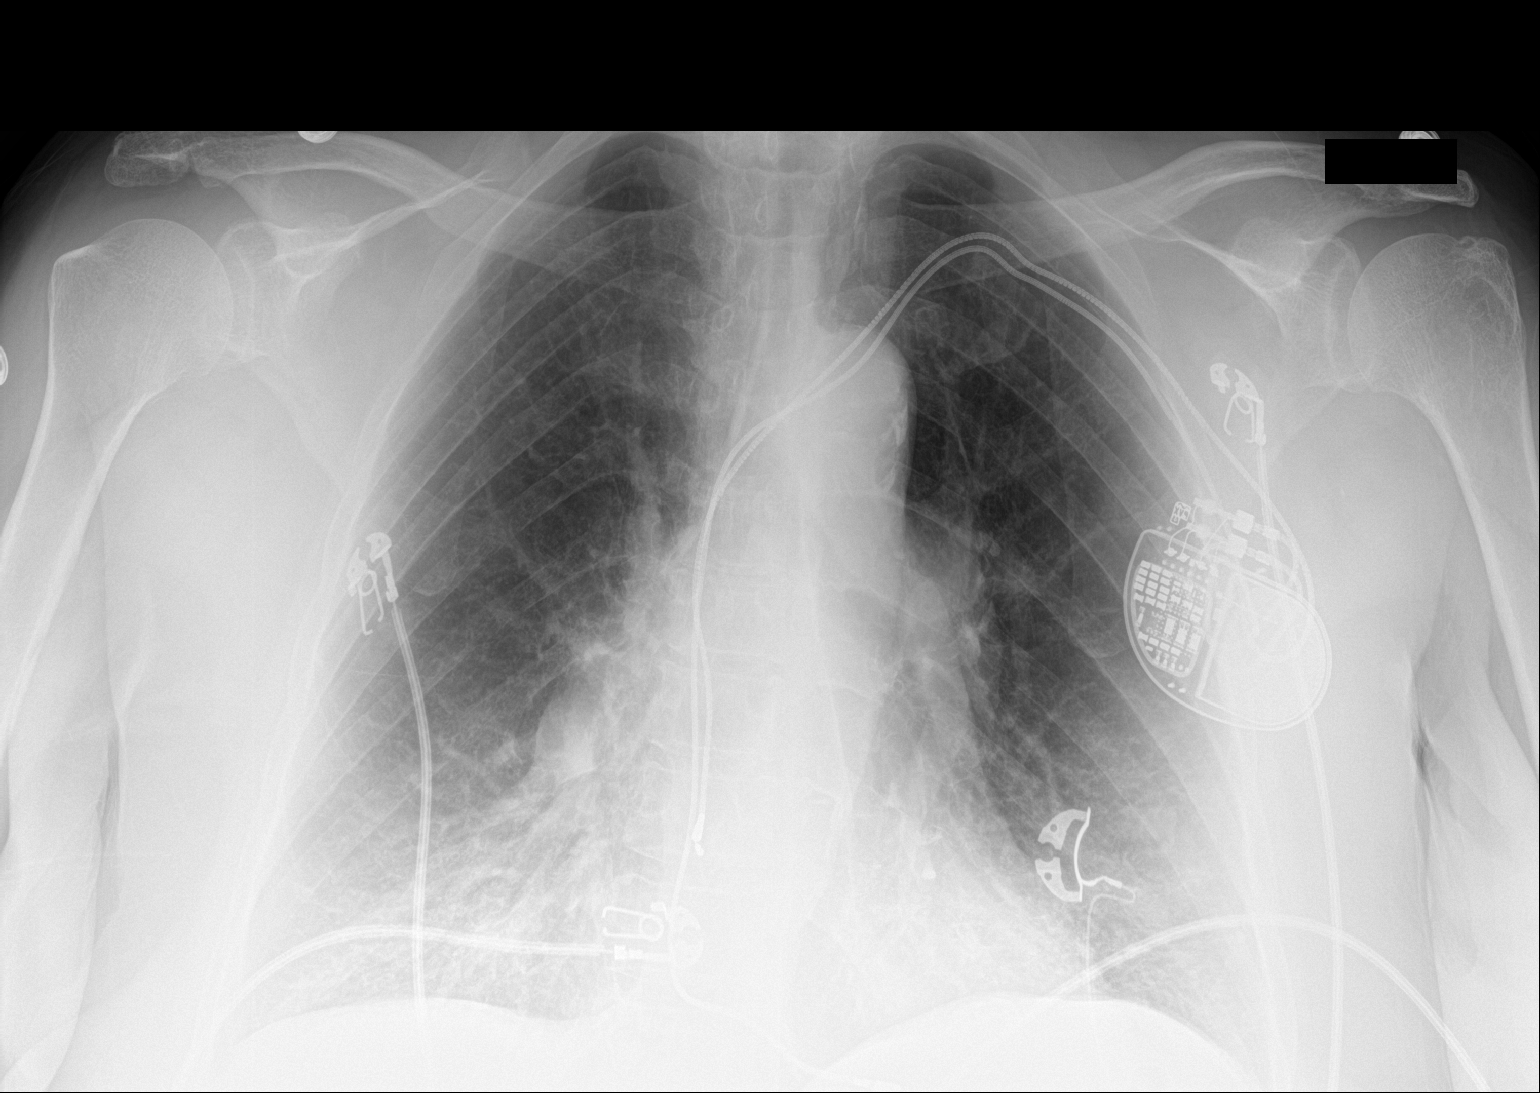

[1 of 1 positions shown; findings below may reference images not displayed]

FINDINGS: Cardiac pacer with lead tip over the right atrium right ventricle.
Heart size stable. No pulmonary venous congestion. Bibasilar
pulmonary infiltrates, right side greater than left. No pleural
effusion or pneumothorax.
IMPRESSION: 1. Bibasilar pulmonary infiltrates, right side greater than left.
Findings most consistent with pneumonia. Bibasilar pulmonary edema
could also present in this fashion.

2. Cardiac pacer in stable position. Heart size normal. No pulmonary
venous congestion.

## 2020-12-14 IMAGING — DX DG CHEST 1V PORT
1 series · 1 of 1 positions shown · non-contrast
Comparison: 12/17/2019; 12/12/2019

CLINICAL DATA: Shortness of breath.  [id number] infection.

EXAM:
PORTABLE CHEST 1 VIEW

[chest ap]
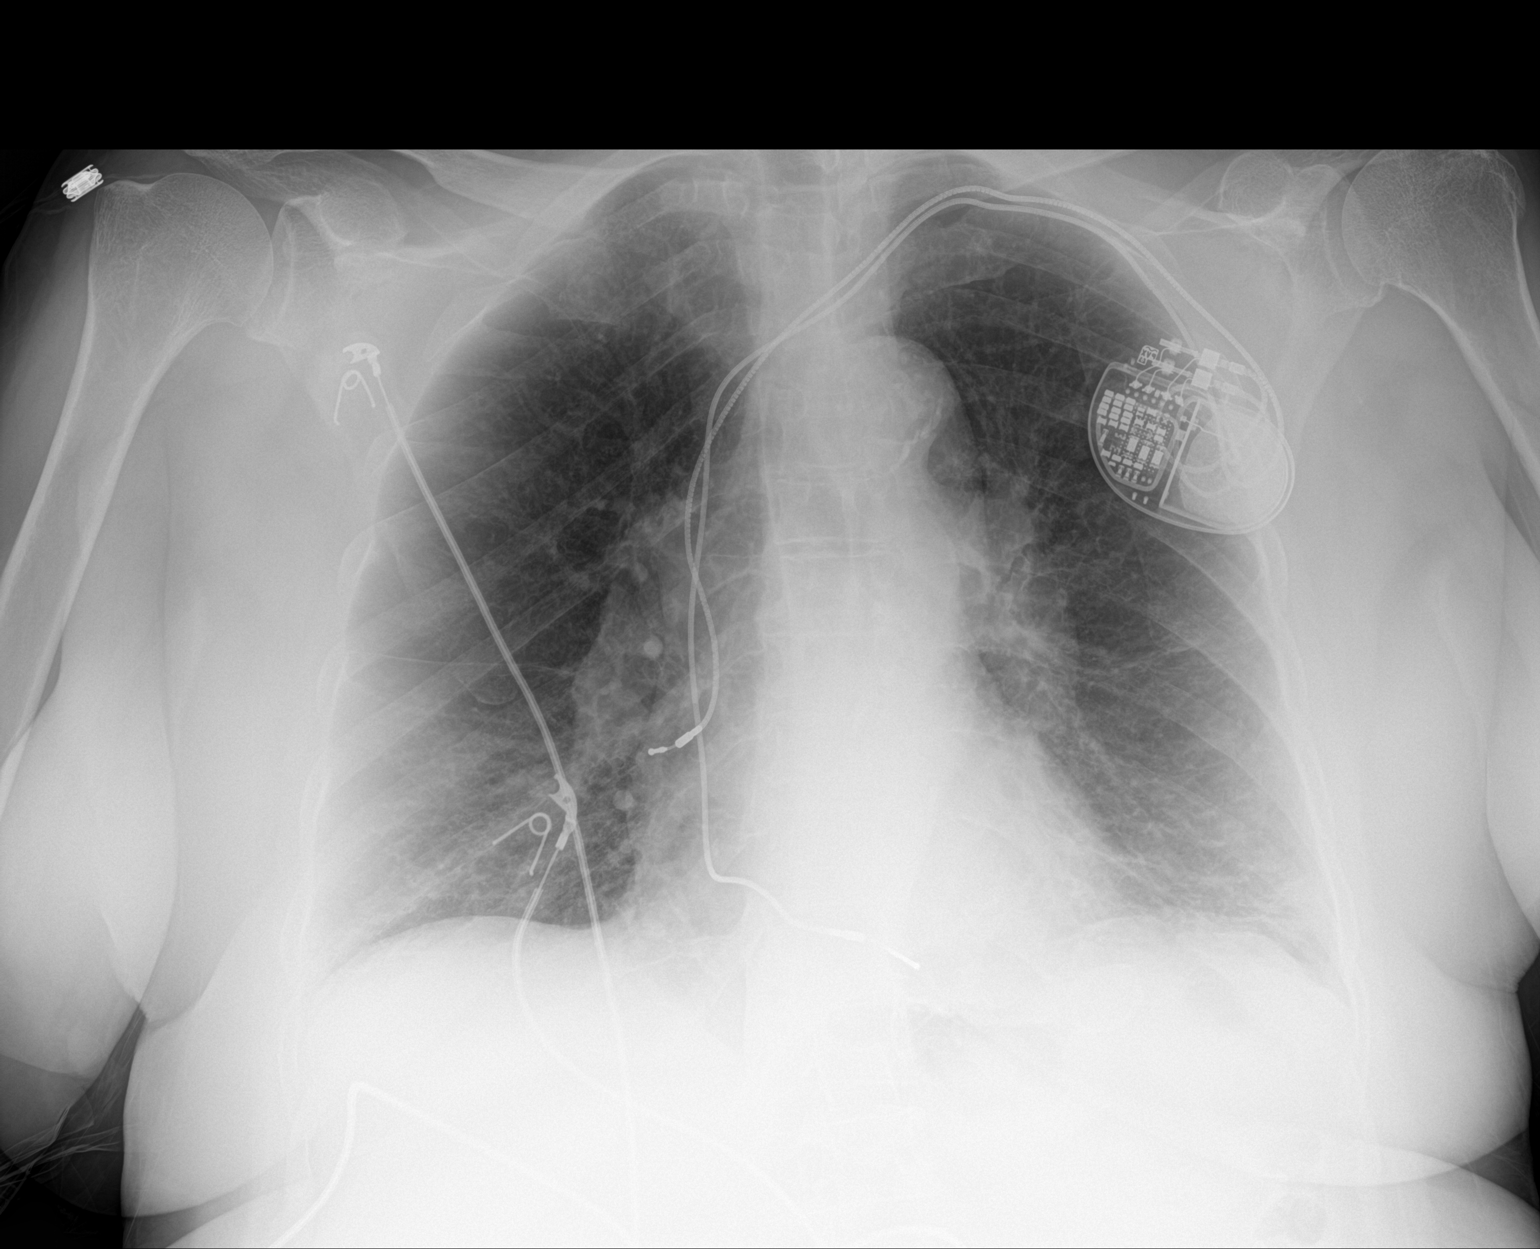

[1 of 1 positions shown; findings below may reference images not displayed]

FINDINGS: Grossly unchanged cardiac silhouette and mediastinal contours with
atherosclerotic plaque within the thoracic aorta. Stable position of
support apparatus. Overall improved aeration of the lungs with
minimal residual bibasilar opacities perihilar new focal airspace
opacities. The lungs remain hyperexpanded with flat diaphragms and
thinning of the biapical pulmonary parenchyma. No acute osseous
abnormalities.
IMPRESSION: Overall improved aeration of the lungs with minimal residual
bibasilar atelectasis.

## 2020-12-25 ENCOUNTER — Telehealth (INDEPENDENT_AMBULATORY_CARE_PROVIDER_SITE_OTHER): Payer: Medicare Other | Admitting: Nurse Practitioner

## 2020-12-25 ENCOUNTER — Telehealth: Payer: Self-pay | Admitting: Family Medicine

## 2020-12-25 DIAGNOSIS — R0602 Shortness of breath: Secondary | ICD-10-CM

## 2020-12-25 DIAGNOSIS — B349 Viral infection, unspecified: Secondary | ICD-10-CM

## 2020-12-25 MED ORDER — PREDNISONE 20 MG PO TABS: 20.0000 mg | ORAL_TABLET | Freq: Every day | ORAL | 0 refills | Status: AC

## 2020-12-25 NOTE — Patient Instructions (Signed)
Viral illness Shortness of breath Cough:   Stay well hydrated  Stay active  Deep breathing exercises  May take tylenol or fever or pain  May take mucinex twice daily  Will order prednisone  Continue albuterol as needed  Follow up:  Follow up  if needed

## 2020-12-25 NOTE — Progress Notes (Signed)
Virtual Visit via Telephone Note  I connected with Kylie Sanders on 12/25/20 at  3:30 PM EST by telephone and verified that I am speaking with the correct person using two identifiers.  Location: Patient: home Provider: remote   I discussed the limitations, risks, security and privacy concerns of performing an evaluation and management service by telephone and the availability of in person appointments. I also discussed with the patient that there may be a patient responsible charge related to this service. The patient expressed understanding and agreed to proceed.   History of Present Illness:  Patient presents today for tele-visit. Patient states that she does not remember when her symptoms started. She is having shortness of breath. She went to the ED, but left due to long wait times. Patient does have albuterol inhaler. She has called EMS to come out and assess her. She is awaiting for them to arrive, but does not want to be transported to the ED. I advised her that I can order prednisone, but if EMS thinks that she needs to go on to the ED, then please go as advised. Denies f/c/s, n/v/d, hemoptysis, PND, chest pain or edema.     Observations/Objective:  Vitals with BMI 09/28/2020 06/26/2020 05/27/2020  Height 5\' 4"  5\' 4"  -  Weight 165 lbs 6 oz 168 lbs 1 oz 169 lbs  BMI 28.38 28.83 -  Systolic 104 116  Diastolic 63 81 65  Pulse 64 73 62      Assessment and Plan:  Viral illness Shortness of breath Cough:   Stay well hydrated  Stay active  Deep breathing exercises  May take tylenol or fever or pain  May take mucinex twice daily  Will order prednisone  Continue albuterol as needed   Follow Up Instructions:  Follow up  if needed    I discussed the assessment and treatment plan with the patient. The patient was provided an opportunity to ask questions and all were answered. The patient agreed with the plan and demonstrated an understanding of the  instructions.   The patient was advised to call back or seek an in-person evaluation if the symptoms worsen or if the condition fails to improve as anticipated.  I provided 22 minutes of non-face-to-face time during this encounter.   , NP

## 2020-12-29 NOTE — Telephone Encounter (Signed)
Spoke to patient

## 2021-01-12 DIAGNOSIS — R058 Other specified cough: Secondary | ICD-10-CM

## 2021-01-12 DIAGNOSIS — E785 Hyperlipidemia, unspecified: Secondary | ICD-10-CM

## 2021-01-12 HISTORY — DX: Other specified cough: R05.8

## 2021-01-12 HISTORY — DX: Hyperlipidemia, unspecified: E78.5

## 2021-01-21 ENCOUNTER — Telehealth: Payer: Self-pay | Admitting: Family Medicine

## 2021-01-21 NOTE — Telephone Encounter (Signed)
Spoke with [facility] they are able to process losartan.  Informed patient rx should be ready for pick up today.

## 2021-01-22 ENCOUNTER — Other Ambulatory Visit: Payer: Self-pay | Admitting: Family Medicine

## 2021-01-22 DIAGNOSIS — I10 Essential (primary) hypertension: Secondary | ICD-10-CM

## 2021-01-22 MED ORDER — LOSARTAN POTASSIUM-HCTZ 50-12.5 MG PO TABS: 1.0000 | ORAL_TABLET | Freq: Every day | ORAL | 3 refills | Status: DC

## 2021-02-01 ENCOUNTER — Other Ambulatory Visit: Payer: Self-pay

## 2021-02-01 ENCOUNTER — Ambulatory Visit (INDEPENDENT_AMBULATORY_CARE_PROVIDER_SITE_OTHER): Payer: Medicare Other | Admitting: Family Medicine

## 2021-02-01 ENCOUNTER — Encounter: Payer: Self-pay | Admitting: Family Medicine

## 2021-02-01 VITALS — BP 104/72 | HR 63 | Temp 97.0°F | Resp 16 | Ht 64.0 in | Wt 163.0 lb

## 2021-02-01 DIAGNOSIS — H539 Unspecified visual disturbance: Secondary | ICD-10-CM

## 2021-02-01 DIAGNOSIS — R0602 Shortness of breath: Secondary | ICD-10-CM | POA: Diagnosis not present

## 2021-02-01 DIAGNOSIS — Z09 Encounter for follow-up examination after completed treatment for conditions other than malignant neoplasm: Secondary | ICD-10-CM

## 2021-02-01 DIAGNOSIS — E538 Deficiency of other specified B group vitamins: Secondary | ICD-10-CM

## 2021-02-01 DIAGNOSIS — J45909 Unspecified asthma, uncomplicated: Secondary | ICD-10-CM | POA: Diagnosis not present

## 2021-02-01 DIAGNOSIS — I509 Heart failure, unspecified: Secondary | ICD-10-CM

## 2021-02-01 DIAGNOSIS — Z0000 Encounter for general adult medical examination without abnormal findings: Secondary | ICD-10-CM | POA: Diagnosis not present

## 2021-02-01 DIAGNOSIS — E559 Vitamin D deficiency, unspecified: Secondary | ICD-10-CM | POA: Diagnosis not present

## 2021-02-01 DIAGNOSIS — K029 Dental caries, unspecified: Secondary | ICD-10-CM

## 2021-02-01 DIAGNOSIS — G8929 Other chronic pain: Secondary | ICD-10-CM

## 2021-02-01 DIAGNOSIS — R059 Cough, unspecified: Secondary | ICD-10-CM | POA: Diagnosis not present

## 2021-02-01 DIAGNOSIS — M549 Dorsalgia, unspecified: Secondary | ICD-10-CM

## 2021-02-01 DIAGNOSIS — H9193 Unspecified hearing loss, bilateral: Secondary | ICD-10-CM

## 2021-02-01 MED ORDER — CYCLOBENZAPRINE HCL 10 MG PO TABS: 10.0000 mg | ORAL_TABLET | Freq: Three times a day (TID) | ORAL | 3 refills | Status: DC | PRN

## 2021-02-01 MED ORDER — ACETAMINOPHEN 500 MG PO TABS: 1000.0000 mg | ORAL_TABLET | Freq: Two times a day (BID) | ORAL | 12 refills | Status: AC | PRN

## 2021-02-01 MED ORDER — ALBUTEROL SULFATE (2.5 MG/3ML) 0.083% IN NEBU: 2.5000 mg | INHALATION_SOLUTION | Freq: Four times a day (QID) | RESPIRATORY_TRACT | 11 refills | Status: DC | PRN

## 2021-02-01 NOTE — Progress Notes (Signed)
[facility]   Annual Physical   Subjective:  Patient ID: Kylie Sanders, female    DOB: April 02, 1951  Age: 70 y.o. MRN: [id number]  CC:  Chief Complaint  Patient presents with  . Follow-up    HPI Kylie GamblesShirley Mae Gatlin is a 70 year old female who presents for Annual Physical today.   Patient Active Problem List   Diagnosis Date Noted  . Viral illness 12/25/2020  . Shortness of breath 03/01/2020  . Chest congestion 03/01/2020  . Nausea 03/01/2020  . History of 2019 novel coronavirus disease (COVID-19) 02/02/2020  . Moderate asthma without complication 02/02/2020  . Congestive heart failure (HCC) 02/02/2020  . Former cigar smoker 02/02/2020  . Acute kidney injury due to COVID-19 (HCC) 12/18/2019  . AKI (acute kidney injury) (HCC) 12/17/2019  . Pneumonia due to COVID-19 virus 12/17/2019  . Dehydration 12/17/2019   Current Status: Since her last office visit, she has c/o chronic lower back pain, which she takes OTC pain medications for relief. She reports decreased hearing in right ear. She denies fevers, chills, fatigue, recent infections, weight loss, and night sweats. She has not had any headaches, visual changes, dizziness, and falls. No chest pain, heart palpitations, cough and shortness of breath reported. Denies GI problems such as nausea, vomiting, diarrhea, and constipation. She has no reports of blood in stools, dysuria and hematuria. No depression or anxiety reported today. She is taking all medications as prescribed. She denies pain today.   Past Medical History:  Diagnosis Date  . Asthma   . CHF (congestive heart failure) (HCC)   . Diabetes mellitus without complication (HCC)   . History of 2019 novel coronavirus disease (COVID-19) 12/12/2019  . Hyperlipidemia   . Hypertension   . Hypokalemia 12/2019  . Myocardial infarction (HCC)   . Seizures (HCC)   . Shortness of breath   . Stroke (HCC)   . Vitamin D deficiency  01/2020  . Wheezes     Past Surgical History:  Procedure Laterality Date  . PACEMAKER IMPLANT      No family history on file.  Social History   Socioeconomic History  . Marital status: Single    Spouse name: Not on file  . Number of children: Not on file  . Years of education: Not on file  . Highest education level: Not on file  Occupational History  . Not on file  Tobacco Use  . Smoking status: Former Games developermoker  . Smokeless tobacco: Never Used  Vaping Use  . Vaping Use: Never used  Substance and Sexual Activity  . Alcohol use: Not Currently  . Drug use: Never  . Sexual activity: Not Currently  Other Topics Concern  . Not on file  Social History Narrative  . Not on file   Social Determinants of Health   Financial Resource Strain: Not on file  Food Insecurity: Not on file  Transportation Needs: Not on file  Physical Activity: Not on file  Stress: Not on file  Social Connections: Not on file  Intimate Partner Violence: Not on file    Outpatient Medications Prior to Visit  Medication Sig Dispense Refill  . albuterol (VENTOLIN HFA) 108 (90 Base) MCG/ACT inhaler Inhale 2 puffs into the lungs every 4 (four) hours as needed for wheezing or shortness of breath. 18 g 11  . feeding supplement, ENSURE ENLIVE, (ENSURE ENLIVE) LIQD Take 237 mLs by mouth 2 (two) times daily between meals. 237 mL 12  .  fluticasone (FLONASE) 50 MCG/ACT nasal spray SHAKE LIQUID AND USE 2 SPRAYS IN EACH NOSTRIL DAILY 48 g 3  . Fluticasone-Salmeterol (ADVAIR) 100-50 MCG/DOSE AEPB Inhale 1 puff into the lungs 2 (two) times daily. 1 each 11  . losartan-hydrochlorothiazide (HYZAAR) 50-12.5 MG tablet Take 1 tablet by mouth daily. 90 tablet 3  . lubiprostone (AMITIZA) 24 MCG capsule Take 1 capsule (24 mcg total) by mouth 2 (two) times daily with a meal. 60 capsule 11  . montelukast (SINGULAIR) 10 MG tablet Take 1 tablet (10 mg total) by mouth at bedtime. 30 tablet 11  . Nebulizer MISC 1 each by Does not  apply route every 6 (six) hours as needed. 1 each 0  . omeprazole (PRILOSEC) 20 MG capsule Take 1 capsule (20 mg total) by mouth daily. 90 capsule 6  . acetaminophen (TYLENOL) 500 MG tablet Take 2 tablets (1,000 mg total) by mouth 2 (two) times daily as needed. 120 tablet 6  . albuterol (PROVENTIL) (2.5 MG/3ML) 0.083% nebulizer solution Take 3 mLs (2.5 mg total) by nebulization every 6 (six) hours as needed for wheezing or shortness of breath. 75 mL 11  . tiZANidine (ZANAFLEX) 4 MG tablet Take 1-2 tablets (4-8 mg total) by mouth every 6 (six) hours as needed for muscle spasms. 60 tablet 3  . ondansetron (ZOFRAN) 4 MG tablet Take 1 tablet (4 mg total) by mouth every 6 (six) hours as needed for nausea. (Patient not taking: No sig reported) 30 tablet 3  . sodium chloride (OCEAN) 0.65 % SOLN nasal spray Place 1 spray into both nostrils as needed for congestion. (Patient not taking: No sig reported) 30 mL 11  . Vitamin D, Ergocalciferol, (DRISDOL) 1.25 MG (50000 UNIT) CAPS capsule Take 1 capsule (50,000 Units total) by mouth every 7 (seven) days. (Patient not taking: No sig reported) 5 capsule 6  . benzonatate (TESSALON) 100 MG capsule Take 1 capsule (100 mg total) by mouth 2 (two) times daily as needed for cough. (Patient not taking: Reported on 02/01/2021) 20 capsule 0  . HYDROcodone-homatropine (HYCODAN) 5-1.5 MG/5ML syrup Take 5 mLs by mouth every 8 (eight) hours as needed for cough. (Patient not taking: No sig reported) 120 mL 0  . potassium chloride (KLOR-CON) 10 MEQ tablet Take 1 tablet (10 mEq total) by mouth 2 (two) times daily. (Patient not taking: No sig reported) 60 tablet 3   No facility-administered medications prior to visit.    No Known Allergies  ROS Review of Systems  Constitutional: Negative.   HENT: Positive for congestion (chest congestion).   Eyes: Negative.   Respiratory: Positive for cough (frequently) and shortness of breath (occasional ).   Cardiovascular: Negative.    Gastrointestinal: Positive for abdominal distention (obese).  Endocrine: Negative.   Genitourinary: Negative.   Musculoskeletal: Positive for arthralgias (generalized joint pain) and back pain (chronic ).  Skin: Negative.   Allergic/Immunologic: Negative.   Neurological: Positive for dizziness (occasional ) and headaches (occasional ).  Hematological: Negative.   Psychiatric/Behavioral: Negative.       Objective:    Physical Exam Vitals and nursing note reviewed.  Constitutional:      Appearance: Normal appearance.  HENT:     Head: Normocephalic and atraumatic.     Nose: Nose normal.     Mouth/Throat:     Mouth: Mucous membranes are moist.     Pharynx: Oropharynx is clear.  Cardiovascular:     Rate and Rhythm: Normal rate and regular rhythm.     Pulses: Normal  pulses.     Heart sounds: Normal heart sounds.  Pulmonary:     Effort: Pulmonary effort is normal.     Breath sounds: Normal breath sounds.  Abdominal:     General: Bowel sounds are normal. There is distension (obese).     Palpations: Abdomen is soft.  Musculoskeletal:        General: Normal range of motion.     Cervical back: Normal range of motion and neck supple.  Skin:    General: Skin is warm.  Neurological:     General: No focal deficit present.     Mental Status: She is alert and oriented to person, place, and time.  Psychiatric:        Mood and Affect: Mood normal.        Behavior: Behavior normal.        Thought Content: Thought content normal.        Judgment: Judgment normal.     BP 104/72   Pulse 63   Temp (!) 97 F (36.1 C)   Resp 16   Wt 163 lb (73.9 kg)   SpO2 98%   BMI 27.98 kg/m  Wt Readings from Last 3 Encounters:  02/01/21 163 lb (73.9 kg)  09/28/20 165 lb 6.4 oz (75 kg)  06/26/20 168 lb 0.6 oz (76.2 kg)     Health Maintenance Due  Topic Date Due  . Hepatitis C Screening  Never done  . COVID-19 Vaccine (1) Never done  . TETANUS/TDAP  Never done  . COLONOSCOPY (Pts  45-2yrs Insurance coverage will need to be confirmed)  Never done  . MAMMOGRAM  Never done  . DEXA SCAN  Never done  . PNA vac Low Risk Adult (1 of 2 - PCV13) 10/05/2018    There are no preventive care reminders to display for this patient.  Lab Results  Component Value Date   TSH 1.070 01/29/2020   Lab Results  Component Value Date   WBC 5.3 01/29/2020   HGB 12.3 01/29/2020   HCT 38.3 01/29/2020   MCV 87 01/29/2020   PLT 287 01/29/2020   Lab Results  Component Value Date   NA 139 01/29/2020   K 4.6 01/29/2020   CO2 22 01/29/2020   GLUCOSE 87 01/29/2020   BUN 16 01/29/2020   CREATININE 1.17 (H) 01/29/2020   BILITOT 0.4 01/29/2020   ALKPHOS 96 01/29/2020   AST 9 01/29/2020   ALT 8 01/29/2020   PROT 7.4 01/29/2020   ALBUMIN 4.2 01/29/2020   CALCIUM 12.3 (H) 01/29/2020   ANIONGAP 9 12/22/2019   Lab Results  Component Value Date   CHOL 209 (H) 01/29/2020   Lab Results  Component Value Date   HDL 69 01/29/2020   Lab Results  Component Value Date   LDLCALC 120 (H) 01/29/2020   Lab Results  Component Value Date   TRIG 116 01/29/2020   Lab Results  Component Value Date   CHOLHDL 3.0 01/29/2020   No results found for: HGBA1C    Assessment & Plan:   1. Annual physical exam Physical assessment within normal for age. Basic Neurology assessment revealed minor deficits in balance and equilibrium.   Follow-up for scheduled mammogram as needed.  Recommend monthly self breast exam Recommend daily multivitamin for women Recommend strength training in 150 minutes of cardiovascular exercise per week  2. Moderate asthma without complication, unspecified whether persistent - albuterol (PROVENTIL) (2.5 MG/3ML) 0.083% nebulizer solution; Take 3 mLs (2.5 mg total) by nebulization every  6 (six) hours as needed for wheezing or shortness of breath.  Dispense: 75 mL; Refill: 11 - CBC with Differential  3. Congestive heart failure, unspecified HF chronicity, unspecified  heart failure type (HCC) - albuterol (PROVENTIL) (2.5 MG/3ML) 0.083% nebulizer solution; Take 3 mLs (2.5 mg total) by nebulization every 6 (six) hours as needed for wheezing or shortness of breath.  Dispense: 75 mL; Refill: 11 - CBC with Differential  4. Cough - albuterol (PROVENTIL) (2.5 MG/3ML) 0.083% nebulizer solution; Take 3 mLs (2.5 mg total) by nebulization every 6 (six) hours as needed for wheezing or shortness of breath.  Dispense: 75 mL; Refill: 11  5. Shortness of breath Stable. No signs or symptoms of respiratory distress noted or reported. - albuterol (PROVENTIL) (2.5 MG/3ML) 0.083% nebulizer solution; Take 3 mLs (2.5 mg total) by nebulization every 6 (six) hours as needed for wheezing or shortness of breath.  Dispense: 75 mL; Refill: 11  6. Chronic back pain, unspecified back location, unspecified back pain laterality - acetaminophen (TYLENOL) 500 MG tablet; Take 2 tablets (1,000 mg total) by mouth 2 (two) times daily as needed.  Dispense: 120 tablet; Refill: 12 - cyclobenzaprine (FLEXERIL) 10 MG tablet; Take 1 tablet (10 mg total) by mouth 3 (three) times daily as needed for muscle spasms.  Dispense: 90 tablet; Refill: 3  7. Vitamin D deficiency - Vitamin D, 25-hydroxy  8. Vitamin B12 deficiency - Vitamin B12  9. Visual changes - Ambulatory referral to Ophthalmology  10. Decreased hearing of both ears  11. Dental caries - Ambulatory referral to Dentistry  12. Healthcare maintenance - Comprehensive metabolic panel - TSH - Lipid Panel  13. Follow up She will follow up in 3 months.   Meds ordered this encounter  Medications  . acetaminophen (TYLENOL) 500 MG tablet    Sig: Take 2 tablets (1,000 mg total) by mouth 2 (two) times daily as needed.    Dispense:  120 tablet    Refill:  12  . albuterol (PROVENTIL) (2.5 MG/3ML) 0.083% nebulizer solution    Sig: Take 3 mLs (2.5 mg total) by nebulization every 6 (six) hours as needed for wheezing or shortness of breath.     Dispense:  75 mL    Refill:  11  . cyclobenzaprine (FLEXERIL) 10 MG tablet    Sig: Take 1 tablet (10 mg total) by mouth 3 (three) times daily as needed for muscle spasms.    Dispense:  90 tablet    Refill:  3    Orders Placed This Encounter  Procedures  . CBC with Differential  . Comprehensive metabolic panel  . TSH  . Lipid Panel  . Vitamin B12  . Vitamin D, 25-hydroxy  . Ambulatory referral to Dentistry  . Ambulatory referral to Ophthalmology     Referral Orders     Ambulatory referral to Dentistry     Ambulatory referral to Ophthalmology   Raliegh Ip, MSN, ANE, FNP-BC [facility] [address] [place], [place] [address] [phone] fax  Problem List Items Addressed This Visit      Cardiovascular and Mediastinum   Congestive heart failure (HCC)   Relevant Medications   albuterol (PROVENTIL) (2.5 MG/3ML) 0.083% nebulizer solution   Other Relevant Orders   CBC with Differential     Respiratory   Moderate asthma without complication   Relevant Medications   albuterol (PROVENTIL) (2.5 MG/3ML) 0.083% nebulizer solution   Other Relevant Orders  CBC with Differential     Other   Shortness of breath   Relevant Medications   albuterol (PROVENTIL) (2.5 MG/3ML) 0.083% nebulizer solution    Other Visit Diagnoses    Annual physical exam    -  Primary   Cough       Relevant Medications   albuterol (PROVENTIL) (2.5 MG/3ML) 0.083% nebulizer solution   Chronic back pain, unspecified back location, unspecified back pain laterality       Relevant Medications   acetaminophen (TYLENOL) 500 MG tablet   cyclobenzaprine (FLEXERIL) 10 MG tablet   Vitamin D deficiency       Relevant Orders   Vitamin D, 25-hydroxy   Vitamin B12 deficiency       Relevant Orders   Vitamin B12   Visual changes       Relevant Orders   Ambulatory referral to Ophthalmology   Decreased  hearing of both ears       Dental caries       Relevant Orders   Ambulatory referral to Dentistry   Healthcare maintenance       Relevant Orders   Comprehensive metabolic panel   TSH   Lipid Panel      Meds ordered this encounter  Medications  . acetaminophen (TYLENOL) 500 MG tablet    Sig: Take 2 tablets (1,000 mg total) by mouth 2 (two) times daily as needed.    Dispense:  120 tablet    Refill:  12  . albuterol (PROVENTIL) (2.5 MG/3ML) 0.083% nebulizer solution    Sig: Take 3 mLs (2.5 mg total) by nebulization every 6 (six) hours as needed for wheezing or shortness of breath.    Dispense:  75 mL    Refill:  11  . cyclobenzaprine (FLEXERIL) 10 MG tablet    Sig: Take 1 tablet (10 mg total) by mouth 3 (three) times daily as needed for muscle spasms.    Dispense:  90 tablet    Refill:  3    Follow-up: No follow-ups on file.    Kallie Locks, FNP

## 2021-02-01 NOTE — Progress Notes (Signed)
Needs RF on albuterol  Has pain right hip and lower back 9/10, over 3 months, constant  Aching in right arm.  Would like a referral to ophthalmologist and  dentist

## 2021-02-02 LAB — VITAMIN B12: Vitamin B-12: 763 pg/mL (ref 232–1245)

## 2021-02-02 LAB — COMPREHENSIVE METABOLIC PANEL
ALT: 8 IU/L (ref 0–32)
AST: 14 IU/L (ref 0–40)
Albumin/Globulin Ratio: 1.3 (ref 1.2–2.2)
Albumin: 4 g/dL (ref 3.8–4.8)
Alkaline Phosphatase: 99 IU/L (ref 44–121)
BUN/Creatinine Ratio: 13 (ref 12–28)
BUN: 14 mg/dL (ref 8–27)
Bilirubin Total: 0.3 mg/dL (ref 0.0–1.2)
CO2: 24 mmol/L (ref 20–29)
Calcium: 11.6 mg/dL — ABNORMAL HIGH (ref 8.7–10.3)
Chloride: 102 mmol/L (ref 96–106)
Creatinine, Ser: 1.04 mg/dL — ABNORMAL HIGH (ref 0.57–1.00)
GFR calc Af Amer: 64 mL/min/{1.73_m2} (ref >59)
GFR calc non Af Amer: 56 mL/min/{1.73_m2} — ABNORMAL LOW (ref >59)
Globulin, Total: 3.2 g/dL (ref 1.5–4.5)
Glucose: 92 mg/dL (ref 65–99)
Potassium: 3.2 mmol/L — ABNORMAL LOW (ref 3.5–5.2)
Sodium: 139 mmol/L (ref 134–144)
Total Protein: 7.2 g/dL (ref 6.0–8.5)

## 2021-02-02 LAB — CBC WITH DIFFERENTIAL/PLATELET
Basophils Absolute: 0 10*3/uL (ref 0.0–0.2)
Basos: 1 %
EOS (ABSOLUTE): 0.1 10*3/uL (ref 0.0–0.4)
Eos: 2 %
Hematocrit: 34.7 % (ref 34.0–46.6)
Hemoglobin: 11.7 g/dL (ref 11.1–15.9)
Immature Grans (Abs): 0 10*3/uL (ref 0.0–0.1)
Immature Granulocytes: 0 %
Lymphocytes Absolute: 2.4 10*3/uL (ref 0.7–3.1)
Lymphs: 48 %
MCH: 29 pg (ref 26.6–33.0)
MCHC: 33.7 g/dL (ref 31.5–35.7)
MCV: 86 fL (ref 79–97)
Monocytes Absolute: 0.3 10*3/uL (ref 0.1–0.9)
Monocytes: 7 %
Neutrophils Absolute: 2.1 10*3/uL (ref 1.4–7.0)
Neutrophils: 42 %
Platelets: 248 10*3/uL (ref 150–450)
RBC: 4.04 x10E6/uL (ref 3.77–5.28)
RDW: 12.3 % (ref 11.7–15.4)
WBC: 4.9 10*3/uL (ref 3.4–10.8)

## 2021-02-02 LAB — LIPID PANEL
Chol/HDL Ratio: 4.5 ratio — ABNORMAL HIGH (ref 0.0–4.4)
Cholesterol, Total: 262 mg/dL — ABNORMAL HIGH (ref 100–199)
HDL: 58 mg/dL (ref >39)
LDL Chol Calc (NIH): 180 mg/dL — ABNORMAL HIGH (ref 0–99)
Triglycerides: 133 mg/dL (ref 0–149)
VLDL Cholesterol Cal: 24 mg/dL (ref 5–40)

## 2021-02-02 LAB — TSH: TSH: 1.21 u[IU]/mL (ref 0.450–4.500)

## 2021-02-02 LAB — VITAMIN D 25 HYDROXY (VIT D DEFICIENCY, FRACTURES): Vit D, 25-Hydroxy: 14 ng/mL — ABNORMAL LOW (ref 30.0–100.0)

## 2021-02-10 ENCOUNTER — Encounter: Payer: Self-pay | Admitting: Family Medicine

## 2021-02-10 ENCOUNTER — Other Ambulatory Visit: Payer: Self-pay | Admitting: Family Medicine

## 2021-02-10 DIAGNOSIS — R059 Cough, unspecified: Secondary | ICD-10-CM

## 2021-02-10 DIAGNOSIS — E876 Hypokalemia: Secondary | ICD-10-CM

## 2021-02-10 DIAGNOSIS — E785 Hyperlipidemia, unspecified: Secondary | ICD-10-CM

## 2021-02-10 DIAGNOSIS — R058 Other specified cough: Secondary | ICD-10-CM

## 2021-02-10 MED ORDER — ATORVASTATIN CALCIUM 10 MG PO TABS: 10.0000 mg | ORAL_TABLET | Freq: Every day | ORAL | 3 refills | Status: DC

## 2021-02-10 MED ORDER — POTASSIUM CHLORIDE CRYS ER 20 MEQ PO TBCR: 20.0000 meq | EXTENDED_RELEASE_TABLET | Freq: Every day | ORAL | 2 refills | Status: DC

## 2021-02-10 MED ORDER — GUAIFENESIN ER 600 MG PO TB12: 600.0000 mg | ORAL_TABLET | Freq: Two times a day (BID) | ORAL | 1 refills | Status: DC

## 2021-02-10 MED ORDER — BENZONATATE 100 MG PO CAPS: 100.0000 mg | ORAL_CAPSULE | Freq: Two times a day (BID) | ORAL | 1 refills | Status: DC | PRN

## 2021-03-24 ENCOUNTER — Other Ambulatory Visit: Payer: Self-pay | Admitting: Family Medicine

## 2021-04-29 ENCOUNTER — Ambulatory Visit (INDEPENDENT_AMBULATORY_CARE_PROVIDER_SITE_OTHER): Payer: Medicare Other | Admitting: Nurse Practitioner

## 2021-04-29 ENCOUNTER — Ambulatory Visit: Payer: Medicare Other

## 2021-04-29 VITALS — BP 104/67 | HR 74 | Temp 97.9°F | Resp 18 | Ht 64.0 in | Wt 163.0 lb

## 2021-04-29 DIAGNOSIS — Z0000 Encounter for general adult medical examination without abnormal findings: Secondary | ICD-10-CM | POA: Diagnosis not present

## 2021-04-29 DIAGNOSIS — Z9189 Other specified personal risk factors, not elsewhere classified: Secondary | ICD-10-CM

## 2021-04-29 DIAGNOSIS — Z1159 Encounter for screening for other viral diseases: Secondary | ICD-10-CM

## 2021-04-29 DIAGNOSIS — Z87891 Personal history of nicotine dependence: Secondary | ICD-10-CM

## 2021-04-29 DIAGNOSIS — Z1211 Encounter for screening for malignant neoplasm of colon: Secondary | ICD-10-CM

## 2021-04-29 NOTE — Patient Instructions (Addendum)
Kylie Sanders , Thank you for taking time to come for your Medicare Wellness Visit. I appreciate your ongoing commitment to your health goals. Please review the following plan we discussed and let me know if I can assist you in the future.   These are the goals we discussed: Goals    . DIET - REDUCE FAT INTAKE       This is a list of the screening recommended for you and due dates:  Health Maintenance  Topic Date Due  . COVID-19 Vaccine (1) Never done  . Hepatitis C Screening: USPSTF Recommendation to screen - Ages 62-79 yo.  Never done  . Tetanus Vaccine  Never done  . Colon Cancer Screening  Never done  . Mammogram  Never done  . DEXA scan (bone density measurement)  Never done  . Pneumonia vaccines (1 of 2 - PCV13) 10/05/2018  . Flu Shot  07/12/2021  . HPV Vaccine  Aged Out    Bone Density Test A bone density test uses a type of X-ray to measure the amount of calcium and other minerals in a person's bones. It can measure bone density in the hip and the spine. The test is similar to having a regular X-ray. This test may also be called:  Bone densitometry.  Bone mineral density test.  Dual-energy X-ray absorptiometry (DEXA). You may have this test to:  Diagnose a condition that causes weak or thin bones (osteoporosis).  Screen you for osteoporosis.  Predict your risk for a broken bone (fracture).  Determine how well your osteoporosis treatment is working. Tell a health care provider about:  Any allergies you have.  All medicines you are taking, including vitamins, herbs, eye drops, creams, and over-the-counter medicines.  Any problems you or family members have had with anesthetic medicines.  Any blood disorders you have.  Any surgeries you have had.  Any medical conditions you have.  Whether you are pregnant or may be pregnant.  Any medical tests you have had within the past 14 days that used contrast material. What are the risks? Generally, this is a safe  test. However, it does expose you to a small amount of radiation, which can slightly increase your cancer risk. What happens before the test?  Do not take any calcium supplements within the 24 hours before your test.  You will need to remove all metal jewelry, eyeglasses, removable dental appliances, and any other metal objects on your body. What happens during the test?  You will lie down on an exam table. There will be an X-ray generator below you and an imaging device above you.  Other devices, such as boxes or braces, may be used to position your body properly for the scan.  The machine will slowly scan your body. You will need to keep very still while the machine does the scan.  The images will show up on a screen in the room. Images will be examined by a specialist after your test is finished. The procedure may vary among health care providers and hospitals.   What can I expect after the test? It is up to you to get the results of your test. Ask your health care provider, or the department that is doing the test, when your results will be ready. Summary  A bone density test is an imaging test that uses a type of X-ray to measure the amount of calcium and other minerals in your bones.  The test may be used to diagnose or  screen you for a condition that causes weak or thin bones (osteoporosis), predict your risk for a broken bone (fracture), or determine how well your osteoporosis treatment is working.  Do not take any calcium supplements within 24 hours before your test.  Ask your health care provider, or the department that is doing the test, when your results will be ready. This information is not intended to replace advice given to you by your health care provider. Make sure you discuss any questions you have with your health care provider. Document Revised: 05/14/2020 Document Reviewed: 05/14/2020 Elsevier Patient Education  2021 Elsevier Inc.   Colonoscopy, Adult A  colonoscopy is a procedure to look at the entire large intestine. This procedure is done using a long, thin, flexible tube that has a camera on the end. You may have a colonoscopy:  As a part of normal colorectal screening.  If you have certain symptoms, such as: ? A low number of red blood cells in your blood (anemia). ? Diarrhea that does not go away. ? Pain in your abdomen. ? Blood in your stool. A colonoscopy can help screen for and diagnose medical problems, including:  Tumors.  Extra tissue that grows where mucus forms (polyps).  Inflammation.  Areas of bleeding. Tell your health care provider about:  Any allergies you have.  All medicines you are taking, including vitamins, herbs, eye drops, creams, and over-the-counter medicines.  Any problems you or family members have had with anesthetic medicines.  Any blood disorders you have.  Any surgeries you have had.  Any medical conditions you have.  Any problems you have had with having bowel movements.  Whether you are pregnant or may be pregnant. What are the risks? Generally, this is a safe procedure. However, problems may occur, including:  Bleeding.  Damage to your intestine.  Allergic reactions to medicines given during the procedure.  Infection. This is rare. What happens before the procedure? Eating and drinking restrictions Follow instructions from your health care provider about eating or drinking restrictions, which may include:  A few days before the procedure: ? Follow a low-fiber diet. ? Avoid nuts, seeds, dried fruit, raw fruits, and vegetables.  1-3 days before the procedure: ? Eat only gelatin dessert or ice pops. ? Drink only clear liquids, such as water, clear juice, clear broth or bouillon, black coffee or tea, or clear soft drinks or sports drinks. ? Avoid liquids that contain red or purple dye.  The day of the procedure: ? Do not eat solid foods. You may continue to drink clear  liquids until up to 2 hours before the procedure. ? Do not eat or drink anything starting 2 hours before the procedure, or within the time period that your health care provider recommends. Bowel prep If you were prescribed a bowel prep to take by mouth (orally) to clean out your colon:  Take it as told by your health care provider. Starting the day before your procedure, you will need to drink a large amount of liquid medicine. The liquid will cause you to have many bowel movements of loose stool until your stool becomes almost clear or light green.  If your skin or the opening between the buttocks (anus) gets irritated from diarrhea, you may relieve the irritation using: ? Wipes with medicine in them, such as adult wet wipes with aloe and vitamin E. ? A product to soothe skin, such as petroleum jelly.  If you vomit while drinking the bowel prep: ? Take a  break for up to 60 minutes. ? Begin the bowel prep again. ? Call your health care provider if you keep vomiting or you cannot take the bowel prep without vomiting.  To clean out your colon, you may also be given: ? Laxative medicines. These help you have a bowel movement. ? Instructions for enema use. An enema is liquid medicine injected into your rectum. Medicines Ask your health care provider about:  Changing or stopping your regular medicines or supplements. This is especially important if you are taking iron supplements, diabetes medicines, or blood thinners.  Taking medicines such as aspirin and ibuprofen. These medicines can thin your blood. Do not take these medicines unless your health care provider tells you to take them.  Taking over-the-counter medicines, vitamins, herbs, and supplements. General instructions  Ask your health care provider what steps will be taken to help prevent infection. These may include washing skin with a germ-killing soap.  Plan to have someone take you home from the hospital or clinic. What happens  during the procedure?  An IV will be inserted into one of your veins.  You may be given one or more of the following: ? A medicine to help you relax (sedative). ? A medicine to numb the area (local anesthetic). ? A medicine to make you fall asleep (general anesthetic). This is rarely needed.  You will lie on your side with your knees bent.  The tube will: ? Have oil or gel put on it (be lubricated). ? Be inserted into your anus. ? Be gently eased through all parts of your large intestine.  Air will be sent into your colon to keep it open. This may cause some pressure or cramping.  Images will be taken with the camera and will appear on a screen.  A small tissue sample may be removed to be looked at under a microscope (biopsy). The tissue may be sent to a lab for testing if any signs of problems are found.  If small polyps are found, they may be removed and checked for cancer cells.  When the procedure is finished, the tube will be removed. The procedure may vary among health care providers and hospitals.   What happens after the procedure?  Your blood pressure, heart rate, breathing rate, and blood oxygen level will be monitored until you leave the hospital or clinic.  You may have a small amount of blood in your stool.  You may pass gas and have mild cramping or bloating in your abdomen. This is caused by the air that was used to open your colon during the exam.  Do not drive for 24 hours after the procedure.  It is up to you to get the results of your procedure. Ask your health care provider, or the department that is doing the procedure, when your results will be ready. Summary  A colonoscopy is a procedure to look at the entire large intestine.  Follow instructions from your health care provider about eating and drinking before the procedure.  If you were prescribed an oral bowel prep to clean out your colon, take it as told by your health care provider.  During the  colonoscopy, a flexible tube with a camera on its end is inserted into the anus and then passed into the other parts of the large intestine. This information is not intended to replace advice given to you by your health care provider. Make sure you discuss any questions you have with your health care provider.  Document Revised: 06/21/2019 Document Reviewed: 06/21/2019 Elsevier Patient Education  2021 Elsevier Inc.   Fat and Cholesterol Restricted Eating Plan Eating a diet that limits fat and cholesterol may help lower your risk for heart disease and other conditions. Your body needs fat and cholesterol for basic functions, but eating too much of these things can be harmful to your health. Your health care provider may order lab tests to check your blood fat (lipid) and cholesterol levels. This helps your health care provider understand your risk for certain conditions and whether you need to make diet changes. Work with your health care provider or dietitian to make an eating plan that is right for you. Your plan includes:  Limit your fat intake to ______% or less of your total calories a day.  Limit your saturated fat intake to ______% or less of your total calories a day.  Limit the amount of cholesterol in your diet to less than _________mg a day.  Eat ___________ g of fiber a day. What are tips for following this plan? General guidelines  If you are overweight, work with your health care provider to lose weight safely. Losing just 5-10% of your body weight can improve your overall health and help prevent diseases such as diabetes and heart disease.  Avoid: ? Foods with added sugar. ? Fried foods. ? Foods that contain partially hydrogenated oils, including stick margarine, some tub margarines, cookies, crackers, and other baked goods.  Limit alcohol intake to no more than 1 drink a day for nonpregnant women and 2 drinks a day for men. One drink equals 12 oz of beer, 5 oz of wine, or  1 oz of hard liquor.   Reading food labels  Check food labels for: ? Trans fats, partially hydrogenated oils, or high amounts of saturated fat. Avoid foods that contain saturated fat and trans fat. ? The amount of cholesterol in each serving. Try to eat no more than 200 mg of cholesterol each day. ? The amount of fiber in each serving. Try to eat at least 20-30 g of fiber each day.  Choose foods with healthy fats, such as: ? Monounsaturated and polyunsaturated fats. These include olive and canola oil, flaxseeds, walnuts, almonds, and seeds. ? Omega-3 fats. These are found in foods such as salmon, mackerel, sardines, tuna, flaxseed oil, and ground flaxseeds.  Choose grain products that have whole grains. Look for the word "whole" as the first word in the ingredient list. Cooking  Cook foods using methods other than frying. Baking, boiling, grilling, and broiling are some healthy options.  Eat more home-cooked food and less restaurant, buffet, and fast food.  Avoid cooking using saturated fats. ? Animal sources of saturated fats include meats, butter, and cream. ? Plant sources of saturated fats include palm oil, palm kernel oil, and coconut oil. Meal planning  At meals, imagine dividing your plate into fourths: ? Fill one-half of your plate with vegetables and green salads. ? Fill one-fourth of your plate with whole grains. ? Fill one-fourth of your plate with lean protein foods.  Eat fish that is high in omega-3 fats at least two times a week.  Eat more foods that contain fiber, such as whole grains, beans, apples, broccoli, carrots, peas, and barley. These foods help promote healthy cholesterol levels in the blood.   Recommended foods Grains  Whole grains, such as whole wheat or whole grain breads, crackers, cereals, and pasta. Unsweetened oatmeal, bulgur, barley, quinoa, or brown rice. Corn or whole  wheat flour tortillas. Vegetables  Fresh or frozen vegetables (raw, steamed,  roasted, or grilled). Green salads. Fruits  All fresh, canned (in natural juice), or frozen fruits. Meats and other protein foods  Ground beef (85% or leaner), grass-fed beef, or beef trimmed of fat. Skinless chicken or [place]. Ground chicken or [place]. Pork trimmed of fat. All fish and seafood. Egg whites. Dried beans, peas, or lentils. Unsalted nuts or seeds. Unsalted canned beans. Natural nut butters without added sugar and oil. Dairy  Low-fat or nonfat dairy products, such as skim or 1% milk, 2% or reduced-fat cheeses, low-fat and fat-free ricotta or cottage cheese, or plain low-fat and nonfat yogurt. Fats and oils  Tub margarine without trans fats. Light or reduced-fat mayonnaise and salad dressings. Avocado. Olive, canola, sesame, or safflower oils. The items listed above may not be a complete list of foods and beverages you can eat. Contact a dietitian for more information. Foods to avoid Grains  White bread. White pasta. White rice. Cornbread. Bagels, pastries, and croissants. Crackers and snack foods that contain trans fat and hydrogenated oils. Vegetables  Vegetables cooked in cheese, cream, or butter sauce. Fried vegetables. Fruits  Canned fruit in heavy syrup. Fruit in cream or butter sauce. Fried fruit. Meats and other protein foods  Fatty cuts of meat. Ribs, chicken wings, bacon, sausage, bologna, salami, chitterlings, fatback, hot dogs, bratwurst, and packaged lunch meats. Liver and organ meats. Whole eggs and egg yolks. Chicken and [place] with skin. Fried meat. Dairy  Whole or 2% milk, cream, half-and-half, and cream cheese. Whole milk cheeses. Whole-fat or sweetened yogurt. Full-fat cheeses. Nondairy creamers and whipped toppings. Processed cheese, cheese spreads, and cheese curds. Beverages  Alcohol. Sugar-sweetened drinks such as sodas, lemonade, and fruit drinks. Fats and oils  Butter, stick margarine, lard, shortening, ghee, or bacon fat. Coconut, palm kernel,  and palm oils. Sweets and desserts  Corn syrup, sugars, honey, and molasses. Candy. Jam and jelly. Syrup. Sweetened cereals. Cookies, pies, cakes, donuts, muffins, and ice cream. The items listed above may not be a complete list of foods and beverages you should avoid. Contact a dietitian for more information. Summary  Your body needs fat and cholesterol for basic functions. However, eating too much of these things can be harmful to your health.  Work with your health care provider and dietitian to follow a diet low in fat and cholesterol. Doing this may help lower your risk for heart disease and other conditions.  Choose healthy fats, such as monounsaturated and polyunsaturated fats, and foods high in omega-3 fatty acids.  Eat fiber-rich foods, such as whole grains, beans, peas, fruits, and vegetables.  Limit or avoid alcohol, fried foods, and foods high in saturated fats, partially hydrogenated oils, and sugar. This information is not intended to replace advice given to you by your health care provider. Make sure you discuss any questions you have with your health care provider. Document Revised: 07/29/2020 Document Reviewed: 04/01/2020 Elsevier Patient Education  2021 [employer].   Fall Prevention in the Home, Adult Falls can cause injuries and can happen to people of all ages. There are many things you can do to make your home safe and to help prevent falls. Ask for help when making these changes. What actions can I take to prevent falls? General Instructions  Use good lighting in all rooms. Replace any light bulbs that burn out.  Turn on the lights in dark areas. Use night-lights.  Keep items that you use often in easy-to-reach  places. Lower the shelves around your home if needed.  Set up your furniture so you have a clear path. Avoid moving your furniture around.  Do not have throw rugs or other things on the floor that can make you trip.  Avoid walking on wet  floors.  If any of your floors are uneven, fix them.  Add color or contrast paint or tape to clearly mark and help you see: ? Grab bars or handrails. ? First and last steps of staircases. ? Where the edge of each step is.  If you use a stepladder: ? Make sure that it is fully opened. Do not climb a closed stepladder. ? Make sure the sides of the stepladder are locked in place. ? Ask someone to hold the stepladder while you use it.  Know where your pets are when moving through your home. What can I do in the bathroom?  Keep the floor dry. Clean up any water on the floor right away.  Remove soap buildup in the tub or shower.  Use nonskid mats or decals on the floor of the tub or shower.  Attach bath mats securely with double-sided, nonslip rug tape.  If you need to sit down in the shower, use a plastic, nonslip stool.  Install grab bars by the toilet and in the tub and shower. Do not use towel bars as grab bars.      What can I do in the bedroom?  Make sure that you have a light by your bed that is easy to reach.  Do not use any sheets or blankets for your bed that hang to the floor.  Have a firm chair with side arms that you can use for support when you get dressed. What can I do in the kitchen?  Clean up any spills right away.  If you need to reach something above you, use a step stool with a grab bar.  Keep electrical cords out of the way.  Do not use floor polish or wax that makes floors slippery. What can I do with my stairs?  Do not leave any items on the stairs.  Make sure that you have a light switch at the top and the bottom of the stairs.  Make sure that there are handrails on both sides of the stairs. Fix handrails that are broken or loose.  Install nonslip stair treads on all your stairs.  Avoid having throw rugs at the top or bottom of the stairs.  Choose a carpet that does not hide the edge of the steps on the stairs.  Check carpeting to make  sure that it is firmly attached to the stairs. Fix carpet that is loose or worn. What can I do on the outside of my home?  Use bright outdoor lighting.  Fix the edges of walkways and driveways and fix any cracks.  Remove anything that might make you trip as you walk through a door, such as a raised step or threshold.  Trim any bushes or trees on paths to your home.  Check to see if handrails are loose or broken and that both sides of all steps have handrails.  Install guardrails along the edges of any raised decks and porches.  Clear paths of anything that can make you trip, such as tools or rocks.  Have leaves, snow, or ice cleared regularly.  Use sand or salt on paths during winter.  Clean up any spills in your garage right away. This includes grease  or oil spills. What other actions can I take?  Wear shoes that: ? Have a low heel. Do not wear high heels. ? Have rubber bottoms. ? Feel good on your feet and fit well. ? Are closed at the toe. Do not wear open-toe sandals.  Use tools that help you move around if needed. These include: ? Canes. ? Walkers. ? Scooters. ? Crutches.  Review your medicines with your doctor. Some medicines can make you feel dizzy. This can increase your chance of falling. Ask your doctor what else you can do to help prevent falls. Where to find more information  Centers for Disease Control and Prevention, [employer]: FootballExhibition.com.br  [employer] on Aging: https://walker.com/ Contact a doctor if:  You are afraid of falling at home.  You feel weak, drowsy, or dizzy at home.  You fall at home. Summary  There are many simple things that you can do to make your home safe and to help prevent falls.  Ways to make your home safe include removing things that can make you trip and installing grab bars in the bathroom.  Ask for help when making these changes in your home. This information is not intended to replace advice given to you by your health  care provider. Make sure you discuss any questions you have with your health care provider. Document Revised: 07/01/2020 Document Reviewed: 07/01/2020 Elsevier Patient Education  2021 Elsevier Inc.   Steps to Quit Smoking Smoking tobacco is the leading cause of preventable death. It can affect almost every organ in the body. Smoking puts you and those around you at risk for developing many serious chronic diseases. Quitting smoking can be difficult, but it is one of the best things that you can do for your health. It is never too late to quit. How do I get ready to quit? When you decide to quit smoking, create a plan to help you succeed. Before you quit:  Pick a date to quit. Set a date within the next 2 weeks to give you time to prepare.  Write down the reasons why you are quitting. Keep this list in places where you will see it often.  Tell your family, friends, and co-workers that you are quitting. Support from your loved ones can make quitting easier.  Talk with your health care provider about your options for quitting smoking.  Find out what treatment options are covered by your health insurance.  Identify people, places, things, and activities that make you want to smoke (triggers). Avoid them. What first steps can I take to quit smoking?  Throw away all cigarettes at home, at work, and in your car.  Throw away smoking accessories, such as Set designer.  Clean your car. Make sure to empty the ashtray.  Clean your home, including curtains and carpets. What strategies can I use to quit smoking? Talk with your health care provider about combining strategies, such as taking medicines while you are also receiving in-person counseling. Using these two strategies together makes you more likely to succeed in quitting than if you used either strategy on its own.  If you are pregnant or breastfeeding, talk with your health care provider about finding counseling or other support  strategies to quit smoking. Do not take medicine to help you quit smoking unless your health care provider tells you to do so. To quit smoking: Quit right away  Quit smoking completely, instead of gradually reducing how much you smoke over a period of time. Research  shows that stopping smoking right away is more successful than gradually quitting.  Attend in-person counseling to help you build problem-solving skills. You are more likely to succeed in quitting if you attend counseling sessions regularly. Even short sessions of 10 minutes can be effective. Take medicine You may take medicines to help you quit smoking. Some medicines require a prescription and some you can purchase over-the-counter. Medicines may have nicotine in them to replace the nicotine in cigarettes. Medicines may:  Help to stop cravings.  Help to relieve withdrawal symptoms. Your health care provider may recommend:  Nicotine patches, gum, or lozenges.  Nicotine inhalers or sprays.  Non-nicotine medicine that is taken by mouth. Find resources Find resources and support systems that can help you to quit smoking and remain smoke-free after you quit. These resources are most helpful when you use them often. They include:  Online chats with a Veterinary surgeon.  Telephone quitlines.  Printed Materials engineer.  Support groups or group counseling.  Text messaging programs.  Mobile phone apps or applications. Use apps that can help you stick to your quit plan by providing reminders, tips, and encouragement. There are many free apps for mobile devices as well as websites. Examples include Quit Guide from the [employer] and smokefree.gov   What things can I do to make it easier to quit?  Reach out to your family and friends for support and encouragement. Call telephone quitlines (1-800-QUIT-NOW), reach out to support groups, or work with a counselor for support.  Ask people who smoke to avoid smoking around you.  Avoid places  that trigger you to smoke, such as bars, parties, or smoke-break areas at work.  Spend time with people who do not smoke.  Lessen the stress in your life. Stress can be a smoking trigger for some people. To lessen stress, try: ? Exercising regularly. ? Doing deep-breathing exercises. ? Doing yoga. ? Meditating. ? Performing a body scan. This involves closing your eyes, scanning your body from head to toe, and noticing which parts of your body are particularly tense. Try to relax the muscles in those areas.   How will I feel when I quit smoking? Day 1 to 3 weeks Within the first 24 hours of quitting smoking, you may start to feel withdrawal symptoms. These symptoms are usually most noticeable 2-3 days after quitting, but they usually do not last for more than 2-3 weeks. You may experience these symptoms:  Mood swings.  Restlessness, anxiety, or irritability.  Trouble concentrating.  Dizziness.  Strong cravings for sugary foods and nicotine.  Mild weight gain.  Constipation.  Nausea.  Coughing or a sore throat.  Changes in how the medicines that you take for unrelated issues work in your body.  Depression.  Trouble sleeping (insomnia). Week 3 and afterward After the first 2-3 weeks of quitting, you may start to notice more positive results, such as:  Improved sense of smell and taste.  Decreased coughing and sore throat.  Slower heart rate.  Lower blood pressure.  Clearer skin.  The ability to breathe more easily.  Fewer sick days. Quitting smoking can be very challenging. Do not get discouraged if you are not successful the first time. Some people need to make many attempts to quit before they achieve long-term success. Do your best to stick to your quit plan, and talk with your health care provider if you have any questions or concerns. Summary  Smoking tobacco is the leading cause of preventable death. Quitting smoking is one  of the best things that you can do  for your health.  When you decide to quit smoking, create a plan to help you succeed.  Quit smoking right away, not slowly over a period of time.  When you start quitting, seek help from your health care provider, family, or friends. This information is not intended to replace advice given to you by your health care provider. Make sure you discuss any questions you have with your health care provider. Document Revised: 08/23/2019 Document Reviewed: 02/16/2019 Elsevier Patient Education  2021 [employer].

## 2021-04-29 NOTE — Progress Notes (Signed)
Subjective:   Kylie Sanders is a 70 y.o. female who presents for Medicare Annual (Subsequent) preventive examination.  Review of Systems    Review of Systems  Constitutional: Negative.   HENT: Negative.   Eyes: Negative.   Respiratory: Negative.   Cardiovascular: Negative.   Gastrointestinal: Negative.   Genitourinary: Negative.   Musculoskeletal: Positive for myalgias.  Skin: Negative.   Neurological: Negative.   Endo/Heme/Allergies: Negative.   Psychiatric/Behavioral: Negative.           Objective:    Today's Vitals   04/29/21 1512 04/29/21 1514  BP: 104/67   Pulse: 74   Resp: 18   Temp: 97.9 F (36.6 C)   SpO2: 100%   Weight: 163 lb (73.9 kg)   Height: 5\' 4"  (1.626 m)   PainSc:  5    Body mass index is 27.98 kg/m.  Advanced Directives 04/29/2021 12/17/2019  Does Patient Have a Medical Advance Directive? No No  Would patient like information on creating a medical advance directive? Yes (ED - Information included in AVS) No - Patient declined    Current Medications (verified) Outpatient Encounter Medications as of 04/29/2021  Medication Sig  . umeclidinium-vilanterol (ANORO ELLIPTA) 62.5-25 MCG/INH AEPB Inhale into the lungs.  01-10-1984 acetaminophen (TYLENOL) 500 MG tablet Take 2 tablets (1,000 mg total) by mouth 2 (two) times daily as needed.  Marland Kitchen albuterol (PROVENTIL) (2.5 MG/3ML) 0.083% nebulizer solution Take 3 mLs (2.5 mg total) by nebulization every 6 (six) hours as needed for wheezing or shortness of breath.  Marland Kitchen albuterol (VENTOLIN HFA) 108 (90 Base) MCG/ACT inhaler Inhale 2 puffs into the lungs every 4 (four) hours as needed for wheezing or shortness of breath.  Marland Kitchen atorvastatin (LIPITOR) 10 MG tablet Take 1 tablet (10 mg total) by mouth daily.  . benzonatate (TESSALON) 100 MG capsule Take 1 capsule (100 mg total) by mouth 2 (two) times daily as needed for cough.  . cyclobenzaprine (FLEXERIL) 10 MG tablet Take 1 tablet (10 mg total) by mouth 3 (three) times  daily as needed for muscle spasms.  . feeding supplement, ENSURE ENLIVE, (ENSURE ENLIVE) LIQD Take 237 mLs by mouth 2 (two) times daily between meals.  . fluticasone (FLONASE) 50 MCG/ACT nasal spray SHAKE LIQUID AND USE 2 SPRAYS IN EACH NOSTRIL DAILY  . Fluticasone-Salmeterol (ADVAIR) 100-50 MCG/DOSE AEPB Inhale 1 puff into the lungs 2 (two) times daily.  Marland Kitchen guaiFENesin (MUCINEX) 600 MG 12 hr tablet Take 1 tablet (600 mg total) by mouth 2 (two) times daily.  Marland Kitchen losartan-hydrochlorothiazide (HYZAAR) 50-12.5 MG tablet Take 1 tablet by mouth daily.  Marland Kitchen lubiprostone (AMITIZA) 24 MCG capsule Take 1 capsule (24 mcg total) by mouth 2 (two) times daily with a meal.  . montelukast (SINGULAIR) 10 MG tablet Take 1 tablet (10 mg total) by mouth at bedtime.  . Nebulizer MISC 1 each by Does not apply route every 6 (six) hours as needed.  Marland Kitchen omeprazole (PRILOSEC) 20 MG capsule Take 1 capsule (20 mg total) by mouth daily.  . ondansetron (ZOFRAN) 4 MG tablet Take 1 tablet (4 mg total) by mouth every 6 (six) hours as needed for nausea. (Patient not taking: No sig reported)  . potassium chloride SA (KLOR-CON) 20 MEQ tablet Take 1 tablet (20 mEq total) by mouth daily.  . sodium chloride (OCEAN) 0.65 % SOLN nasal spray Place 1 spray into both nostrils as needed for congestion. (Patient not taking: No sig reported)  . Vitamin D, Ergocalciferol, (DRISDOL) 1.25 MG (50000 UNIT)  CAPS capsule Take 1 capsule (50,000 Units total) by mouth every 7 (seven) days. (Patient not taking: No sig reported)   No facility-administered encounter medications on file as of 04/29/2021.    Allergies (verified) Patient has no known allergies.   History: Past Medical History:  Diagnosis Date  . Asthma   . CHF (congestive heart failure) (HCC)   . Diabetes mellitus without complication (HCC)   . History of 2019 novel coronavirus disease (COVID-19) 12/12/2019  . Hyperlipidemia   . Hyperlipidemia 01/2021  . Hypertension   . Hypokalemia  12/2019  . Hypokalemia 01/2021  . Myocardial infarction (HCC)   . Productive cough 01/2021  . Seizures (HCC)   . Shortness of breath   . Stroke (HCC)   . Vitamin D deficiency 01/2020  . Wheezes    Past Surgical History:  Procedure Laterality Date  . PACEMAKER IMPLANT     History reviewed. No pertinent family history. Social History   Socioeconomic History  . Marital status: Single    Spouse name: Not on file  . Number of children: Not on file  . Years of education: Not on file  . Highest education level: Not on file  Occupational History  . Not on file  Tobacco Use  . Smoking status: Former Games developer  . Smokeless tobacco: Never Used  Vaping Use  . Vaping Use: Never used  Substance and Sexual Activity  . Alcohol use: Not Currently  . Drug use: Never  . Sexual activity: Not Currently  Other Topics Concern  . Not on file  Social History Narrative  . Not on file   Social Determinants of Health   Financial Resource Strain: Medium Risk  . Difficulty of Paying Living Expenses: Somewhat hard  Food Insecurity: No Food Insecurity  . Worried About Programme researcher, broadcasting/film/video in the Last Year: Never true  . Ran Out of Food in the Last Year: Never true  Transportation Needs: No Transportation Needs  . Lack of Transportation (Medical): No  . Lack of Transportation (Non-Medical): No  Physical Activity: Insufficiently Active  . Days of Exercise per Week: 7 days  . Minutes of Exercise per Session: 10 min  Stress: No Stress Concern Present  . Feeling of Stress : Only a little  Social Connections: Socially Isolated  . Frequency of Communication with Friends and Family: Twice a week  . Frequency of Social Gatherings with Friends and Family: Never  . Attends Religious Services: Never  . Active Member of Clubs or Organizations: No  . Attends Banker Meetings: Never  . Marital Status: Never married    Tobacco Counseling Counseling given: Yes   Clinical  Intake:  Pre-visit preparation completed: No  Pain : 0-10 Pain Score: 5  Pain Type: Chronic pain Pain Location: Other (Comment) Pain Orientation: Right Pain Descriptors / Indicators: Numbness,Burning Pain Onset: More than a month ago Pain Frequency: Several days a week     BMI - recorded: 27.98 Diabetes: No  How often do you need to have someone help you when you read instructions, pamphlets, or other written materials from your doctor or pharmacy?: 3 - Sometimes What is the last grade level you completed in school?: 6th grade  Diabetic?yes  Interpreter Needed?: No      Activities of Daily Living In your present state of health, do you have any difficulty performing the following activities: 04/29/2021  Hearing? N  Vision? N  Difficulty concentrating or making decisions? N  Walking or climbing stairs? N  Dressing or bathing? N  Doing errands, shopping? N  Preparing Food and eating ? N  Using the Toilet? N  In the past six months, have you accidently leaked urine? N  Do you have problems with loss of bowel control? N  Managing your Medications? N  Managing your Finances? N  Housekeeping or managing your Housekeeping? N  Some recent data might be hidden    No care team member to display  Indicate any recent Medical Services you may have received from other than [facility] providers in the past year (date may be approximate).     Assessment:   This is a routine wellness examination for GeyserShirley.  Hearing/Vision screen No exam data present  Dietary issues and exercise activities discussed: Current Exercise Habits: Home exercise routine, Type of exercise: walking, Time (Minutes): 10, Frequency (Times/Week): 7, Weekly Exercise (Minutes/Week): 70, Intensity: Mild, Exercise limited by: None identified  Goals Addressed            This Visit's Progress   . DIET - REDUCE FAT INTAKE        Depression Screen PHQ 2/9 Scores 04/29/2021 02/01/2021 09/28/2020 08/26/2020  05/27/2020  PHQ - 2 Score 1 0 0 0 1  PHQ- 9 Score - 0 - - -  Exception Documentation - - Medical reason Medical reason -    Fall Risk Fall Risk  04/29/2021 02/01/2021 09/28/2020 08/26/2020 02/26/2020  Falls in the past year? 1 0 0 0 0  Number falls in past yr: 0 0 0 0 0  Injury with Fall? 0 0 0 0 0  Risk for fall due to : - No Fall Risks No Fall Risks - -  Follow up - Falls evaluation completed - - -    FALL RISK PREVENTION PERTAINING TO THE HOME:  Any stairs in or around the home? Yes  If so, are there any without handrails? Yes  Home free of loose throw rugs in walkways, pet beds, electrical cords, etc? Yes  Adequate lighting in your home to reduce risk of falls? Yes   ASSISTIVE DEVICES UTILIZED TO PREVENT FALLS:  Life alert? No  Use of a cane, walker or w/c? Yes  Grab bars in the bathroom? No  Shower chair or bench in shower? Yes  Elevated toilet seat or a handicapped toilet? No   TIMED UP AND GO:  Was the test performed? Yes .  Length of time to ambulate 10 feet: 5 sec.   Gait steady and fast without use of assistive device  Cognitive Function: MMSE - Mini Mental State Exam 04/29/2021  Orientation to time 5  Orientation to Place 5  Registration 3  Attention/ Calculation 5  Recall 3  Language- name 2 objects 2  Language- repeat 0  Language- follow 3 step command 3  Language- read & follow direction 1  Write a sentence 0  Copy design 0  Total score 27        Immunizations Immunization History  Administered Date(s) Administered  . Fluad Quad(high Dose 65+) 09/25/2020    TDAP status: Due, Education has been provided regarding the importance of this vaccine. Advised may receive this vaccine at local pharmacy or Health Dept. Aware to provide a copy of the vaccination record if obtained from local pharmacy or Health Dept. Verbalized acceptance and understanding.  Flu Vaccine status: Up to date  Pneumococcal vaccine status: Due, Education has been provided  regarding the importance of this vaccine. Advised may receive this vaccine at local pharmacy or  Health Dept. Aware to provide a copy of the vaccination record if obtained from local pharmacy or Health Dept. Verbalized acceptance and understanding.  Covid-19 vaccine status: Completed vaccines  Qualifies for Shingles Vaccine? No   Zostavax completed No   Shingrix Completed?: No.    Education has been provided regarding the importance of this vaccine. Patient has been advised to call insurance company to determine out of pocket expense if they have not yet received this vaccine. Advised may also receive vaccine at local pharmacy or Health Dept. Verbalized acceptance and understanding.  Screening Tests Health Maintenance  Topic Date Due  . COVID-19 Vaccine (1) Never done  . Hepatitis C Screening  Never done  . TETANUS/TDAP  Never done  . COLONOSCOPY (Pts 45-6yrs Insurance coverage will need to be confirmed)  Never done  . MAMMOGRAM  Never done  . DEXA SCAN  Never done  . PNA vac Low Risk Adult (1 of 2 - PCV13) 10/05/2018  . INFLUENZA VACCINE  07/12/2021  . HPV VACCINES  Aged Out    Health Maintenance  Health Maintenance Due  Topic Date Due  . COVID-19 Vaccine (1) Never done  . Hepatitis C Screening  Never done  . TETANUS/TDAP  Never done  . COLONOSCOPY (Pts 45-44yrs Insurance coverage will need to be confirmed)  Never done  . MAMMOGRAM  Never done  . DEXA SCAN  Never done  . PNA vac Low Risk Adult (1 of 2 - PCV13) 10/05/2018    Colorectal cancer screening: Referral to GI placed today. Pt aware the office will call re: appt.  Mammogram status: Completed recently. Repeat every year  Bone Density status:Pt provided with contact info and advised to call to schedule appt.  Lung Cancer Screening: (Low Dose CT Chest recommended if Age 63-80 years, 30 pack-year currently smoking OR have quit w/in 15years.) does qualify.   Lung Cancer Screening Referral: placed  Additional  Screening:  Hepatitis C Screening: does qualify; recommended at next physical  Vision Screening: Recommended annual ophthalmology exams for early detection of glaucoma and other disorders of the eye. Is the patient up to date with their annual eye exam?  Yes  Who is the provider or what is the name of the office in which the patient attends annual eye exams? Eye care If pt is not established with a provider, would they like to be referred to a provider to establish care? No .   Dental Screening: Recommended annual dental exams for proper oral hygiene  Community Resource Referral / Chronic Care Management: CRR required this visit?  No   CCM required this visit?  No      Plan:     I have personally reviewed and noted the following in the patient's chart:   . Medical and social history . Use of alcohol, tobacco or illicit drugs  . Current medications and supplements including opioid prescriptions.  . Functional ability and status . Nutritional status . Physical activity . Advanced directives . List of other physicians . Hospitalizations, surgeries, and ER visits in previous 12 months . Vitals . Screenings to include cognitive, depression, and falls . Referrals and appointments  In addition, I have reviewed and discussed with patient certain preventive protocols, quality metrics, and best practice recommendations. A written personalized care plan for preventive services as well as general preventive health recommendations were provided to patient.     Ivonne Andrew, NP   04/29/2021

## 2021-05-03 ENCOUNTER — Ambulatory Visit: Payer: Medicare Other | Admitting: Family Medicine

## 2021-05-03 ENCOUNTER — Ambulatory Visit: Payer: Medicare Other | Admitting: Nurse Practitioner

## 2021-05-13 ENCOUNTER — Other Ambulatory Visit: Payer: Self-pay | Admitting: Nurse Practitioner

## 2021-05-13 DIAGNOSIS — E876 Hypokalemia: Secondary | ICD-10-CM

## 2021-05-13 MED ORDER — POTASSIUM CHLORIDE CRYS ER 20 MEQ PO TBCR: 20.0000 meq | EXTENDED_RELEASE_TABLET | Freq: Every day | ORAL | 2 refills | Status: DC

## 2021-06-17 ENCOUNTER — Ambulatory Visit: Payer: Medicare Other | Admitting: Nurse Practitioner

## 2021-07-30 ENCOUNTER — Other Ambulatory Visit: Payer: Self-pay

## 2021-07-30 DIAGNOSIS — R0602 Shortness of breath: Secondary | ICD-10-CM

## 2021-07-30 DIAGNOSIS — J45909 Unspecified asthma, uncomplicated: Secondary | ICD-10-CM

## 2021-07-30 DIAGNOSIS — R059 Cough, unspecified: Secondary | ICD-10-CM

## 2021-07-30 DIAGNOSIS — I509 Heart failure, unspecified: Secondary | ICD-10-CM

## 2021-07-30 MED ORDER — ALBUTEROL SULFATE (2.5 MG/3ML) 0.083% IN NEBU: 2.5000 mg | INHALATION_SOLUTION | Freq: Four times a day (QID) | RESPIRATORY_TRACT | 11 refills | Status: DC | PRN

## 2021-09-06 ENCOUNTER — Telehealth: Payer: Self-pay

## 2021-09-06 DIAGNOSIS — I10 Essential (primary) hypertension: Secondary | ICD-10-CM

## 2021-09-06 DIAGNOSIS — R059 Cough, unspecified: Secondary | ICD-10-CM

## 2021-09-06 DIAGNOSIS — R0602 Shortness of breath: Secondary | ICD-10-CM

## 2021-09-06 DIAGNOSIS — E785 Hyperlipidemia, unspecified: Secondary | ICD-10-CM

## 2021-09-06 DIAGNOSIS — E876 Hypokalemia: Secondary | ICD-10-CM

## 2021-09-06 MED ORDER — LUBIPROSTONE 24 MCG PO CAPS: 24.0000 ug | ORAL_CAPSULE | Freq: Two times a day (BID) | ORAL | 11 refills | Status: DC

## 2021-09-06 MED ORDER — MONTELUKAST SODIUM 10 MG PO TABS: 10.0000 mg | ORAL_TABLET | Freq: Every day | ORAL | 11 refills | Status: DC

## 2021-09-06 MED ORDER — FLUTICASONE PROPIONATE 50 MCG/ACT NA SUSP: NASAL | 3 refills | Status: DC

## 2021-09-06 MED ORDER — POTASSIUM CHLORIDE CRYS ER 20 MEQ PO TBCR: 20.0000 meq | EXTENDED_RELEASE_TABLET | Freq: Every day | ORAL | 2 refills | Status: DC

## 2021-09-06 MED ORDER — OMEPRAZOLE 20 MG PO CPDR: 20.0000 mg | DELAYED_RELEASE_CAPSULE | Freq: Every day | ORAL | 6 refills | Status: DC

## 2021-09-06 MED ORDER — ALBUTEROL SULFATE HFA 108 (90 BASE) MCG/ACT IN AERS: 2.0000 | INHALATION_SPRAY | RESPIRATORY_TRACT | 11 refills | Status: DC | PRN

## 2021-09-06 MED ORDER — LOSARTAN POTASSIUM-HCTZ 50-12.5 MG PO TABS: 1.0000 | ORAL_TABLET | Freq: Every day | ORAL | 3 refills | Status: DC

## 2021-09-06 MED ORDER — ATORVASTATIN CALCIUM 10 MG PO TABS: 10.0000 mg | ORAL_TABLET | Freq: Every day | ORAL | 3 refills | Status: DC

## 2021-09-06 NOTE — Telephone Encounter (Signed)
All med's to be refilled 

## 2021-09-06 NOTE — Telephone Encounter (Signed)
Refills sent

## 2021-09-29 ENCOUNTER — Ambulatory Visit: Payer: Medicare Other | Admitting: Nurse Practitioner

## 2021-11-01 ENCOUNTER — Encounter: Payer: Self-pay | Admitting: Nurse Practitioner

## 2021-11-01 ENCOUNTER — Ambulatory Visit (INDEPENDENT_AMBULATORY_CARE_PROVIDER_SITE_OTHER): Payer: Medicare Other | Admitting: Nurse Practitioner

## 2021-11-01 ENCOUNTER — Other Ambulatory Visit: Payer: Self-pay

## 2021-11-01 VITALS — BP 104/57 | HR 61 | Temp 97.1°F | Ht 64.0 in | Wt 159.0 lb

## 2021-11-01 DIAGNOSIS — G8929 Other chronic pain: Secondary | ICD-10-CM

## 2021-11-01 DIAGNOSIS — R058 Other specified cough: Secondary | ICD-10-CM

## 2021-11-01 DIAGNOSIS — R059 Cough, unspecified: Secondary | ICD-10-CM | POA: Diagnosis not present

## 2021-11-01 DIAGNOSIS — M546 Pain in thoracic spine: Secondary | ICD-10-CM

## 2021-11-01 DIAGNOSIS — J449 Chronic obstructive pulmonary disease, unspecified: Secondary | ICD-10-CM

## 2021-11-01 DIAGNOSIS — R079 Chest pain, unspecified: Secondary | ICD-10-CM | POA: Insufficient documentation

## 2021-11-01 DIAGNOSIS — I509 Heart failure, unspecified: Secondary | ICD-10-CM

## 2021-11-01 DIAGNOSIS — J441 Chronic obstructive pulmonary disease with (acute) exacerbation: Secondary | ICD-10-CM

## 2021-11-01 DIAGNOSIS — E785 Hyperlipidemia, unspecified: Secondary | ICD-10-CM

## 2021-11-01 DIAGNOSIS — R001 Bradycardia, unspecified: Secondary | ICD-10-CM | POA: Insufficient documentation

## 2021-11-01 LAB — POCT URINALYSIS DIP (CLINITEK)
Bilirubin, UA: NEGATIVE
Blood, UA: NEGATIVE
Glucose, UA: NEGATIVE mg/dL
Leukocytes, UA: NEGATIVE
Nitrite, UA: NEGATIVE
POC PROTEIN,UA: NEGATIVE
Spec Grav, UA: 1.02 (ref 1.010–1.025)
Urobilinogen, UA: 1 E.U./dL
pH, UA: 7 (ref 5.0–8.0)

## 2021-11-01 MED ORDER — MELOXICAM 7.5 MG PO TABS: 7.5000 mg | ORAL_TABLET | Freq: Every day | ORAL | 0 refills | Status: DC

## 2021-11-01 MED ORDER — BENZONATATE 100 MG PO CAPS: 100.0000 mg | ORAL_CAPSULE | Freq: Two times a day (BID) | ORAL | 1 refills | Status: DC | PRN

## 2021-11-01 NOTE — Progress Notes (Signed)
[facility] Patient [facility] [address] [place], [place]  [address] Phone:  [phone]   Fax:  [phone]   Established Patient Office Visit  Subjective:  Patient ID: Kylie Sanders, female    DOB: May 29, 1951  Age: 70 y.o. MRN: [id number]  CC:  Chief Complaint  Patient presents with   Follow-up    Pt is here for her follow up visit. Pt states that she has a unproductive cough associated with side pains x 1 month.      HPI [facility] presents for follow up. A former patient of NP [place]oud. She has been lost to follow up. She  has a past medical history of Asthma, CHF (congestive heart failure) (HCC), Diabetes mellitus without complication (HCC), History of 2019 novel coronavirus disease (COVID-19) (12/12/2019), Hyperlipidemia, Hyperlipidemia (01/2021), Hypertension, Hypokalemia (12/2019), Hypokalemia (01/2021), Myocardial infarction [facility]), Productive cough (01/2021), Seizures (HCC), Shortness of breath, Stroke (HCC), Vitamin D deficiency (01/2020), and Wheezes.    She is a poor historian.   Upper Respiratory Infection Patient complains of symptoms of a URI. Symptoms include  cough with mucous . Onset of symptoms was several days ago, and has been gradually worsening since that time. Treatment to date:  Mucinex .  She has low back pain. She that goes down her right leg to her ankle. She reports that this pain has been going on for sometime. She has some numbness and tingling. She denies any falls. She reports previous x-rays; no found in EMR. She has used APAP with no relief.   Past Medical History:  Diagnosis Date   Asthma    CHF (congestive heart failure) (HCC)    Diabetes mellitus without complication (HCC)    History of 2019 novel coronavirus disease (COVID-19) 12/12/2019   Hyperlipidemia    Hyperlipidemia 01/2021   Hypertension    Hypokalemia 12/2019   Hypokalemia 01/2021   Myocardial infarction (HCC)    Productive cough 01/2021   Seizures (HCC)     Shortness of breath    Stroke (HCC)    Vitamin D deficiency 01/2020   Wheezes     Past Surgical History:  Procedure Laterality Date   PACEMAKER IMPLANT      History reviewed. No pertinent family history.  Social History   Socioeconomic History   Marital status: Single    Spouse name: Not on file   Number of children: Not on file   Years of education: Not on file   Highest education level: Not on file  Occupational History   Not on file  Tobacco Use   Smoking status: Former   Smokeless tobacco: Never  Vaping Use   Vaping Use: Never used  Substance and Sexual Activity   Alcohol use: Not Currently   Drug use: Never   Sexual activity: Not Currently  Other Topics Concern   Not on file  Social History Narrative   Not on file   Social Determinants of Health   Financial Resource Strain: Medium Risk   Difficulty of Paying Living Expenses: Somewhat hard  Food Insecurity: No Food Insecurity   Worried About Running Out of Food in the Last Year: Never true   Ran Out of Food in the Last Year: Never true  Transportation Needs: No Transportation Needs   Lack of Transportation (Medical): No   Lack of Transportation (Non-Medical): No  Physical Activity: Insufficiently Active   Days of Exercise per Week: 7 days   Minutes of Exercise per Session: 10 min  Stress: No Stress Concern Present   Feeling of Stress : Only a little  Social Connections: Socially Isolated   Frequency of Communication with Friends and Family: Twice a week   Frequency of Social Gatherings with Friends and Family: Never   Attends Religious Services: Never   Diplomatic Services operational officer: No   Attends Engineer, structural: Never   Marital Status: Never married  Catering manager Violence: Not At Risk   Fear of Current or Ex-Partner: No   Emotionally Abused: No   Physically Abused: No   Sexually Abused: No    Outpatient Medications Prior to Visit  Medication Sig Dispense Refill    acetaminophen (TYLENOL) 500 MG tablet Take 2 tablets (1,000 mg total) by mouth 2 (two) times daily as needed. 120 tablet 12   albuterol (PROVENTIL) (2.5 MG/3ML) 0.083% nebulizer solution Take 3 mLs (2.5 mg total) by nebulization every 6 (six) hours as needed for wheezing or shortness of breath. 75 mL 11   albuterol (VENTOLIN HFA) 108 (90 Base) MCG/ACT inhaler Inhale 2 puffs into the lungs every 4 (four) hours as needed for wheezing or shortness of breath. 18 g 11   atorvastatin (LIPITOR) 10 MG tablet Take 1 tablet (10 mg total) by mouth daily. 90 tablet 3   feeding supplement, ENSURE ENLIVE, (ENSURE ENLIVE) LIQD Take 237 mLs by mouth 2 (two) times daily between meals. 237 mL 12   fluticasone (FLONASE) 50 MCG/ACT nasal spray SHAKE LIQUID AND USE 2 SPRAYS IN EACH NOSTRIL DAILY 48 g 3   Fluticasone-Salmeterol (ADVAIR) 100-50 MCG/DOSE AEPB Inhale 1 puff into the lungs 2 (two) times daily. 1 each 11   losartan-hydrochlorothiazide (HYZAAR) 50-12.5 MG tablet Take 1 tablet by mouth daily. 90 tablet 3   lubiprostone (AMITIZA) 24 MCG capsule Take 1 capsule (24 mcg total) by mouth 2 (two) times daily with a meal. 60 capsule 11   montelukast (SINGULAIR) 10 MG tablet Take 1 tablet (10 mg total) by mouth at bedtime. 30 tablet 11   Nebulizer MISC 1 each by Does not apply route every 6 (six) hours as needed. 1 each 0   umeclidinium-vilanterol (ANORO ELLIPTA) 62.5-25 MCG/INH AEPB Inhale into the lungs.     cyclobenzaprine (FLEXERIL) 10 MG tablet Take 1 tablet (10 mg total) by mouth 3 (three) times daily as needed for muscle spasms. (Patient not taking: Reported on 11/01/2021) 90 tablet 3   guaiFENesin (MUCINEX) 600 MG 12 hr tablet Take 1 tablet (600 mg total) by mouth 2 (two) times daily. (Patient not taking: Reported on 11/01/2021) 60 tablet 1   ketorolac (ACULAR) 0.4 % SOLN      ofloxacin (OCUFLOX) 0.3 % ophthalmic solution      omeprazole (PRILOSEC) 20 MG capsule Take 1 capsule (20 mg total) by mouth daily.  (Patient not taking: Reported on 11/01/2021) 90 capsule 6   ondansetron (ZOFRAN) 4 MG tablet Take 1 tablet (4 mg total) by mouth every 6 (six) hours as needed for nausea. (Patient not taking: Reported on 09/28/2020) 30 tablet 3   potassium chloride SA (KLOR-CON) 20 MEQ tablet Take 1 tablet (20 mEq total) by mouth daily. (Patient not taking: Reported on 11/01/2021) 30 tablet 2   sodium chloride (OCEAN) 0.65 % SOLN nasal spray Place 1 spray into both nostrils as needed for congestion. (Patient not taking: Reported on 09/28/2020) 30 mL 11   Vitamin D, Ergocalciferol, (DRISDOL) 1.25 MG (50000 UNIT) CAPS capsule Take 1 capsule (50,000 Units total) by mouth every 7 (  seven) days. (Patient not taking: Reported on 02/26/2020) 5 capsule 6   benzonatate (TESSALON) 100 MG capsule Take 1 capsule (100 mg total) by mouth 2 (two) times daily as needed for cough. (Patient not taking: Reported on 11/01/2021) 20 capsule 1   No facility-administered medications prior to visit.    No Known Allergies  ROS Review of Systems    Objective:    Physical Exam Constitutional:      General: She is not in acute distress.    Appearance: She is not ill-appearing, toxic-appearing or diaphoretic.  HENT:     Head: Normocephalic and atraumatic.     Nose: Nose normal.     Mouth/Throat:     Mouth: Mucous membranes are moist.  Cardiovascular:     Rate and Rhythm: Normal rate and regular rhythm.     Pulses: Normal pulses.     Heart sounds: Normal heart sounds.  Pulmonary:     Effort: Pulmonary effort is normal.     Comments: diminshed Abdominal:     Palpations: Abdomen is soft.     Comments: hypoactive  Musculoskeletal:        General: Tenderness present.     Cervical back: Normal range of motion.     Right lower leg: No edema.     Left lower leg: No edema.     Comments: Adequate ROM.  Skin:    General: Skin is warm and dry.     Capillary Refill: Capillary refill takes less than 2 seconds.  Neurological:      Mental Status: She is alert.    BP (!) 104/57   Pulse 61   Temp (!) 97.1 F (36.2 C)   Ht 5\' 4"  (1.626 m)   Wt 159 lb (72.1 kg)   SpO2 99%   BMI 27.29 kg/m  Wt Readings from Last 3 Encounters:  11/01/21 159 lb (72.1 kg)  04/29/21 163 lb (73.9 kg)  02/01/21 163 lb (73.9 kg)     Health Maintenance Due  Topic Date Due   INFLUENZA VACCINE  07/12/2021    There are no preventive care reminders to display for this patient.  Lab Results  Component Value Date   TSH 1.210 02/01/2021   Lab Results  Component Value Date   WBC 4.9 02/01/2021   HGB 11.7 02/01/2021   HCT 34.7 02/01/2021   MCV 86 02/01/2021   PLT 248 02/01/2021   Lab Results  Component Value Date   NA 139 02/01/2021   K 3.2 (L) 02/01/2021   CO2 24 02/01/2021   GLUCOSE 92 02/01/2021   BUN 14 02/01/2021   CREATININE 1.04 (H) 02/01/2021   BILITOT 0.3 02/01/2021   ALKPHOS 99 02/01/2021   AST 14 02/01/2021   ALT 8 02/01/2021   PROT 7.2 02/01/2021   ALBUMIN 4.0 02/01/2021   CALCIUM 11.6 (H) 02/01/2021   ANIONGAP 9 12/22/2019   Lab Results  Component Value Date   CHOL 262 (H) 02/01/2021   Lab Results  Component Value Date   HDL 58 02/01/2021   Lab Results  Component Value Date   LDLCALC 180 (H) 02/01/2021   Lab Results  Component Value Date   TRIG 133 02/01/2021   Lab Results  Component Value Date   CHOLHDL 4.5 (H) 02/01/2021   No results found for: HGBA1C    Assessment & Plan:   Problem List Items Addressed This Visit       Cardiovascular and Mediastinum   Congestive heart failure (HCC) Evaluation of  cough  Will encourage cardiology follow up due to underlying conditions    Relevant Orders   CBC with Differential/Platelet   Comp. Metabolic Panel (12)     Other   Hyperlipidemia, unspecified - Continue with current regimen.  No changes warranted.  Evaluation pending   Relevant Orders   POCT URINALYSIS DIP (CLINITEK) (Completed)   Lipid panel   Other Visit Diagnoses      Cough, unspecified type     Persistent  Labs and images pending  Will consider discontinuing Losartan if work negative Pulmonology referral reccommended   Relevant Medications   benzonatate (TESSALON) 100 MG capsule   Chronic obstructive pulmonary disease, unspecified COPD type (HCC) (Chronic)   Persistent  Continue with Advair 100/50 mcg  Will consider increasing Advair 250/50 mcg   Relevant Medications   benzonatate (TESSALON) 100 MG capsule   Chronic right-sided thoracic back pain     Worsening  Evaluation pending   Relevant Medications   meloxicam (MOBIC) 7.5 MG tablet   Other Relevant Orders   DG Thoracic Spine 2 View   DG Lumbar Spine Complete       Meds ordered this encounter  Medications   benzonatate (TESSALON) 100 MG capsule    Sig: Take 1 capsule (100 mg total) by mouth 2 (two) times daily as needed for cough.    Dispense:  20 capsule    Refill:  1    Order Specific Question:   Supervising Provider    Answer:   Quentin Angst [[id number]]   meloxicam (MOBIC) 7.5 MG tablet    Sig: Take 1 tablet (7.5 mg total) by mouth daily.    Dispense:  30 tablet    Refill:  0    Order Specific Question:   Supervising Provider    Answer:   Quentin Angst [id number]    Follow-up: Return in about 6 months (around 05/01/2022).    Barbette Merino, NP

## 2021-11-01 NOTE — Patient Instructions (Signed)
Cough, Adult A cough helps to clear your throat and lungs. A cough may be a sign of an illness or another medical condition. An acute cough may only last 2-3 weeks, while a chronic cough may last 8 or more weeks. Many things can cause a cough. They include: Germs (viruses or bacteria) that attack the airway. Breathing in things that bother (irritate) your lungs. Allergies. Asthma. Mucus that runs down the back of your throat (postnasal drip). Smoking. Acid backing up from the stomach into the tube that moves food from the mouth to the stomach (gastroesophageal reflux). Some medicines. Lung problems. Other medical conditions, such as heart failure or a blood clot in the lung (pulmonary embolism). Follow these instructions at home: Medicines Take over-the-counter and prescription medicines only as told by your doctor. Talk with your doctor before you take medicines that stop a cough (cough suppressants). Lifestyle  Do not smoke, and try not to be around smoke. Do not use any products that contain nicotine or tobacco, such as cigarettes, e-cigarettes, and chewing tobacco. If you need help quitting, ask your doctor. Drink enough fluid to keep your pee (urine) pale yellow. Avoid caffeine. Do not drink alcohol if your doctor tells you not to drink. General instructions  Watch for any changes in your cough. Tell your doctor about them. Always cover your mouth when you cough. Stay away from things that make you cough, such as perfume, candles, campfire smoke, or cleaning products. If the air is dry, use a cool mist vaporizer or humidifier in your home. If your cough is worse at night, try using extra pillows to raise your head up higher while you sleep. Rest as needed. Keep all follow-up visits as told by your doctor. This is important. Contact a doctor if: You have new symptoms. You cough up pus. Your cough does not get better after 2-3 weeks, or your cough gets worse. Cough medicine  does not help your cough and you are not sleeping well. You have pain that gets worse or pain that is not helped with medicine. You have a fever. You are losing weight and you do not know why. You have night sweats. Get help right away if: You cough up blood. You have trouble breathing. Your heartbeat is very fast. These symptoms may be an emergency. Do not wait to see if the symptoms will go away. Get medical help right away. Call your local emergency services (911 in the [place] Do not drive yourself to the hospital. Summary A cough helps to clear your throat and lungs. Many things can cause a cough. Take over-the-counter and prescription medicines only as told by your doctor. Always cover your mouth when you cough. Contact a doctor if you have new symptoms or you have a cough that does not get better or gets worse. This information is not intended to replace advice given to you by your health care provider. Make sure you discuss any questions you have with your health care provider. Document Revised: 01/17/2020 Document Reviewed: 12/17/2018 Elsevier Patient Education  2022 [employer]  Radicular Pain Radicular pain is a type of pain that spreads from your back or neck along a spinal nerve. Spinal nerves are nerves that leave the spinal cord and go to the muscles. Radicular pain is sometimes called radiculopathy, radiculitis, or a pinched nerve. When you have this type of pain, you may also have weakness, numbness, or tingling in the area of your body that is supplied by the nerve. The  pain may feel sharp and burning. Depending on which spinal nerve is affected, the pain may occur in the: Neck area (cervical radicular pain). You may also feel pain, numbness, weakness, or tingling in the arms. Mid-spine area (thoracic radicular pain). You would feel this pain in the back and chest. This type is rare. Lower back area (lumbar radicular pain). You would feel this pain as low back pain. You may  feel pain, numbness, weakness, or tingling in the buttocks or legs. Sciatica is a type of lumbar radicular pain that shoots down the back of the leg. Radicular pain occurs when one of the spinal nerves becomes irritated or squeezed (compressed). It is often caused by something pushing on a spinal nerve, such as one of the bones of the spine (vertebrae) or one of the round cushions between vertebrae (intervertebral disks). This can result from: An injury. Wear and tear or aging of a disk. The growth of a bone spur that pushes on the nerve. Radicular pain often goes away when you follow instructions from your health care provider for relieving pain at home. How is this treated? Treatment may depend on the cause of the condition and may include: Working with a physical therapist. Taking pain medicine. Applying heat or ice or both to the affected areas. Doing stretches to improve flexibility. Having surgery. This may be needed if other treatments do not help. Different types of surgery may be done depending on the cause of this condition. Follow these instructions at home: Managing pain   If directed, put ice on the affected area. To do this: Put ice in a plastic bag. Place a towel between your skin and the bag. Leave the ice on for 20 minutes, 2-3 times a day. Remove the ice if your skin turns bright red. This is very important. If you cannot feel pain, heat, or cold, you have a greater risk of damage to the area. If directed, apply heat to the affected area as often as told by your health care provider. Use the heat source that your health care provider recommends, such as a moist heat pack or a heating pad. Place a towel between your skin and the heat source. Leave the heat on for 20-30 minutes. Remove the heat if your skin turns bright red. This is especially important if you are unable to feel pain, heat, or cold. You have a greater risk of getting burned. Activity Do not sit or rest in  bed for long periods of time. Try to stay as active as possible. Ask your health care provider what type of exercise or activity is best for you. Avoid activities that make your pain worse, such as bending and lifting. You may have to avoid lifting. Ask your health care provider how much you can safely lift. Practice using proper technique when lifting items. Proper lifting technique involves bending your knees and rising up. Do strength and range-of-motion exercises only as told by your health care provider or physical therapist. General instructions Take over-the-counter and prescription medicines only as told by your health care provider. Pay attention to any changes in your symptoms. Keep all follow-up visits. This is important. Contact a health care provider if: Your pain and other symptoms get worse. Your pain medicine is not helping. Your pain has not improved after a few weeks of home care. You have a fever. Get help right away if: You have severe pain, weakness, or numbness. You have difficulty with bladder or bowel control. Summary  Radicular pain is a type of pain that spreads from your back or neck along a spinal nerve. When you have radicular pain, you may also have weakness, numbness, or tingling in the area of your body that is supplied by the nerve. The pain may feel sharp or burning. Radicular pain may be treated with ice, heat, medicines, or physical therapy. This information is not intended to replace advice given to you by your health care provider. Make sure you discuss any questions you have with your health care provider. Document Revised: 06/03/2021 Document Reviewed: 06/03/2021 Elsevier Patient Education  2022 [employer].

## 2021-11-02 LAB — CBC WITH DIFFERENTIAL/PLATELET
Basophils Absolute: 0 10*3/uL (ref 0.0–0.2)
Basos: 1 %
EOS (ABSOLUTE): 0.1 10*3/uL (ref 0.0–0.4)
Eos: 1 %
Hematocrit: 35.8 % (ref 34.0–46.6)
Hemoglobin: 11.6 g/dL (ref 11.1–15.9)
Immature Grans (Abs): 0 10*3/uL (ref 0.0–0.1)
Immature Granulocytes: 0 %
Lymphocytes Absolute: 2.1 10*3/uL (ref 0.7–3.1)
Lymphs: 42 %
MCH: 27.8 pg (ref 26.6–33.0)
MCHC: 32.4 g/dL (ref 31.5–35.7)
MCV: 86 fL (ref 79–97)
Monocytes Absolute: 0.3 10*3/uL (ref 0.1–0.9)
Monocytes: 7 %
Neutrophils Absolute: 2.5 10*3/uL (ref 1.4–7.0)
Neutrophils: 49 %
Platelets: 196 10*3/uL (ref 150–450)
RBC: 4.17 x10E6/uL (ref 3.77–5.28)
RDW: 13.1 % (ref 11.7–15.4)
WBC: 5 10*3/uL (ref 3.4–10.8)

## 2021-11-02 LAB — LIPID PANEL
Chol/HDL Ratio: 2.8 ratio (ref 0.0–4.4)
Cholesterol, Total: 154 mg/dL (ref 100–199)
HDL: 56 mg/dL (ref >39)
LDL Chol Calc (NIH): 80 mg/dL (ref 0–99)
Triglycerides: 96 mg/dL (ref 0–149)
VLDL Cholesterol Cal: 18 mg/dL (ref 5–40)

## 2021-11-02 LAB — COMP. METABOLIC PANEL (12)
AST: 12 IU/L (ref 0–40)
Albumin/Globulin Ratio: 1.4 (ref 1.2–2.2)
Albumin: 3.8 g/dL (ref 3.8–4.8)
Alkaline Phosphatase: 85 IU/L (ref 44–121)
BUN/Creatinine Ratio: 13 (ref 12–28)
BUN: 19 mg/dL (ref 8–27)
Bilirubin Total: 0.3 mg/dL (ref 0.0–1.2)
Calcium: 11.2 mg/dL — ABNORMAL HIGH (ref 8.7–10.3)
Chloride: 103 mmol/L (ref 96–106)
Creatinine, Ser: 1.5 mg/dL — ABNORMAL HIGH (ref 0.57–1.00)
Globulin, Total: 2.7 g/dL (ref 1.5–4.5)
Glucose: 87 mg/dL (ref 70–99)
Potassium: 3.2 mmol/L — ABNORMAL LOW (ref 3.5–5.2)
Sodium: 139 mmol/L (ref 134–144)
Total Protein: 6.5 g/dL (ref 6.0–8.5)
eGFR: 38 mL/min/{1.73_m2} — ABNORMAL LOW (ref >59)

## 2021-11-03 ENCOUNTER — Other Ambulatory Visit: Payer: Self-pay | Admitting: Nurse Practitioner

## 2021-11-03 DIAGNOSIS — E876 Hypokalemia: Secondary | ICD-10-CM

## 2021-11-03 MED ORDER — POTASSIUM CHLORIDE CRYS ER 20 MEQ PO TBCR: 20.0000 meq | EXTENDED_RELEASE_TABLET | Freq: Every day | ORAL | 0 refills | Status: DC

## 2021-11-07 ENCOUNTER — Emergency Department (HOSPITAL_COMMUNITY): Payer: Medicare Other

## 2021-11-07 ENCOUNTER — Other Ambulatory Visit: Payer: Self-pay

## 2021-11-07 ENCOUNTER — Encounter (HOSPITAL_COMMUNITY): Payer: Self-pay | Admitting: Emergency Medicine

## 2021-11-07 ENCOUNTER — Emergency Department (HOSPITAL_COMMUNITY)
Admission: EM | Admit: 2021-11-07 | Discharge: 2021-11-07 | Disposition: A | Discharge status: Home / Self Care | Payer: Medicare Other | Attending: Emergency Medicine | Admitting: Emergency Medicine

## 2021-11-07 DIAGNOSIS — J449 Chronic obstructive pulmonary disease, unspecified: Secondary | ICD-10-CM | POA: Diagnosis not present

## 2021-11-07 DIAGNOSIS — I509 Heart failure, unspecified: Secondary | ICD-10-CM | POA: Insufficient documentation

## 2021-11-07 DIAGNOSIS — J45909 Unspecified asthma, uncomplicated: Secondary | ICD-10-CM | POA: Insufficient documentation

## 2021-11-07 DIAGNOSIS — R059 Cough, unspecified: Secondary | ICD-10-CM | POA: Diagnosis present

## 2021-11-07 DIAGNOSIS — J101 Influenza due to other identified influenza virus with other respiratory manifestations: Secondary | ICD-10-CM | POA: Diagnosis not present

## 2021-11-07 DIAGNOSIS — Z79899 Other long term (current) drug therapy: Secondary | ICD-10-CM | POA: Diagnosis not present

## 2021-11-07 DIAGNOSIS — Z8616 Personal history of COVID-19: Secondary | ICD-10-CM | POA: Insufficient documentation

## 2021-11-07 DIAGNOSIS — Z950 Presence of cardiac pacemaker: Secondary | ICD-10-CM | POA: Insufficient documentation

## 2021-11-07 DIAGNOSIS — Z87891 Personal history of nicotine dependence: Secondary | ICD-10-CM | POA: Diagnosis not present

## 2021-11-07 DIAGNOSIS — I110 Hypertensive heart disease with heart failure: Secondary | ICD-10-CM | POA: Insufficient documentation

## 2021-11-07 DIAGNOSIS — J111 Influenza due to unidentified influenza virus with other respiratory manifestations: Secondary | ICD-10-CM

## 2021-11-07 DIAGNOSIS — Z20822 Contact with and (suspected) exposure to covid-19: Secondary | ICD-10-CM | POA: Insufficient documentation

## 2021-11-07 LAB — URINALYSIS, ROUTINE W REFLEX MICROSCOPIC
Bilirubin Urine: NEGATIVE
Glucose, UA: NEGATIVE mg/dL
Ketones, ur: 5 mg/dL — AB
Leukocytes,Ua: NEGATIVE
Nitrite: NEGATIVE
Protein, ur: 30 mg/dL — AB
Specific Gravity, Urine: 1.026 (ref 1.005–1.030)
pH: 5 (ref 5.0–8.0)

## 2021-11-07 LAB — CBC WITH DIFFERENTIAL/PLATELET
Abs Immature Granulocytes: 0.01 10*3/uL (ref 0.00–0.07)
Basophils Absolute: 0 10*3/uL (ref 0.0–0.1)
Basophils Relative: 0 %
Eosinophils Absolute: 0 10*3/uL (ref 0.0–0.5)
Eosinophils Relative: 0 %
HCT: 39.9 % (ref 36.0–46.0)
Hemoglobin: 12.6 g/dL (ref 12.0–15.0)
Immature Granulocytes: 0 %
Lymphocytes Relative: 35 %
Lymphs Abs: 1.6 10*3/uL (ref 0.7–4.0)
MCH: 27.9 pg (ref 26.0–34.0)
MCHC: 31.6 g/dL (ref 30.0–36.0)
MCV: 88.5 fL (ref 80.0–100.0)
Monocytes Absolute: 0.6 10*3/uL (ref 0.1–1.0)
Monocytes Relative: 13 %
Neutro Abs: 2.4 10*3/uL (ref 1.7–7.7)
Neutrophils Relative %: 52 %
Platelets: 163 10*3/uL (ref 150–400)
RBC: 4.51 MIL/uL (ref 3.87–5.11)
RDW: 14.2 % (ref 11.5–15.5)
WBC: 4.6 10*3/uL (ref 4.0–10.5)
nRBC: 0 % (ref 0.0–0.2)

## 2021-11-07 LAB — COMPREHENSIVE METABOLIC PANEL
ALT: 14 U/L (ref 0–44)
AST: 20 U/L (ref 15–41)
Albumin: 3.6 g/dL (ref 3.5–5.0)
Alkaline Phosphatase: 67 U/L (ref 38–126)
Anion gap: 7 (ref 5–15)
BUN: 18 mg/dL (ref 8–23)
CO2: 24 mmol/L (ref 22–32)
Calcium: 11.1 mg/dL — ABNORMAL HIGH (ref 8.9–10.3)
Chloride: 106 mmol/L (ref 98–111)
Creatinine, Ser: 1.36 mg/dL — ABNORMAL HIGH (ref 0.44–1.00)
GFR, Estimated: 42 mL/min — ABNORMAL LOW (ref >60)
Glucose, Bld: 99 mg/dL (ref 70–99)
Potassium: 3.7 mmol/L (ref 3.5–5.1)
Sodium: 137 mmol/L (ref 135–145)
Total Bilirubin: 0.8 mg/dL (ref 0.3–1.2)
Total Protein: 7.4 g/dL (ref 6.5–8.1)

## 2021-11-07 LAB — PROTIME-INR
INR: 1.1 (ref 0.8–1.2)
Prothrombin Time: 14 seconds (ref 11.4–15.2)

## 2021-11-07 LAB — LIPASE, BLOOD: Lipase: 30 U/L (ref 11–51)

## 2021-11-07 LAB — RESP PANEL BY RT-PCR (FLU A&B, COVID) ARPGX2
Influenza A by PCR: POSITIVE — AB
Influenza B by PCR: NEGATIVE
SARS Coronavirus 2 by RT PCR: NEGATIVE

## 2021-11-07 LAB — APTT: aPTT: 22 seconds — ABNORMAL LOW (ref 24–36)

## 2021-11-07 LAB — LACTIC ACID, PLASMA: Lactic Acid, Venous: 1.3 mmol/L (ref 0.5–1.9)

## 2021-11-07 MED ORDER — ONDANSETRON HCL 4 MG/2ML IJ SOLN
4.0000 mg | Freq: Once | INTRAMUSCULAR | Status: AC
  Administered 2021-11-07: 15:00:00 4 mg via INTRAVENOUS
  Filled 2021-11-07: qty 2

## 2021-11-07 MED ORDER — ONDANSETRON 4 MG PO TBDP: 4.0000 mg | ORAL_TABLET | Freq: Three times a day (TID) | ORAL | 0 refills | Status: AC | PRN

## 2021-11-07 MED ORDER — OSELTAMIVIR PHOSPHATE 75 MG PO CAPS: 75.0000 mg | ORAL_CAPSULE | Freq: Two times a day (BID) | ORAL | 0 refills | Status: DC

## 2021-11-07 MED ORDER — BENZONATATE 100 MG PO CAPS: 100.0000 mg | ORAL_CAPSULE | Freq: Three times a day (TID) | ORAL | 0 refills | Status: DC

## 2021-11-07 MED ORDER — ACETAMINOPHEN 325 MG PO TABS
650.0000 mg | ORAL_TABLET | Freq: Once | ORAL | Status: AC
  Administered 2021-11-07: 15:00:00 650 mg via ORAL
  Filled 2021-11-07: qty 2

## 2021-11-07 MED ORDER — SODIUM CHLORIDE 0.9 % IV BOLUS
500.0000 mL | Freq: Once | INTRAVENOUS | Status: AC
  Administered 2021-11-07: 13:00:00 500 mL via INTRAVENOUS

## 2021-11-07 NOTE — Discharge Instructions (Signed)
You have the flu, which is a viral illness.  This should be treated symptomatically. Take Tamiflu as prescribed, starting tonight. Use Zofran as needed for nausea or vomiting. Use Tessalon to help with cough.  You may need to use your inhaler or your nebulizer more often, use up to 4 times a day as needed for shortness of breath, chest tightness, wheezing. Use Tylenol or ibuprofen as needed for fevers, headaches, or body aches. Make sure you stay well-hydrated with water. Wash your hands frequently to prevent spread of infection. Follow-up with your primary care doctor in 1 week if your symptoms are not improving. Return to the emergency room if you develop chest pain, difficulty breathing, or any new or worsening symptoms.

## 2021-11-07 NOTE — ED Provider Notes (Signed)
[facility] EMERGENCY DEPARTMENT Provider Note   CSN: [id number] Arrival date & time: 11/07/21  1105     History Chief Complaint  Patient presents with   Cough    Kylie Sanders is a 70 y.o. female presenting for evaluation of nausea, vomiting, diarrhea, cough, weakness.  Patient states her symptoms began 3 days ago.  She reports 2-3 episodes of emesis and stool a day.  No blood in her stool or emesis.  She also reports a productive cough.  She has multiple sick contacts at home.  No known fevers.  No shortness of breath or chest pain.  No abdominal pain.  She does feel weak/dizzy when she goes from sitting to standing.  She has had limited p.o. intake due to her symptoms.  She has not taken anything for her symptoms. She is vaccinated for flu.   Additional history taken chart reviewed.  History of asthma, CHF, diabetes, hypertension, hyperlipidemia, CAD, seizures, stroke, copd.    HPI     Past Medical History:  Diagnosis Date   Asthma    CHF (congestive heart failure) (HCC)    Diabetes mellitus without complication (HCC)    History of 2019 novel coronavirus disease (COVID-19) 12/12/2019   Hyperlipidemia    Hyperlipidemia 01/2021   Hypertension    Hypokalemia 12/2019   Hypokalemia 01/2021   Myocardial infarction (HCC)    Productive cough 01/2021   Seizures (HCC)    Shortness of breath    Stroke (HCC)    Vitamin D deficiency 01/2020   Wheezes     Patient Active Problem List   Diagnosis Date Noted   Bradycardia 11/01/2021   Chest pain 11/01/2021   Viral illness 12/25/2020   Shortness of breath 03/01/2020   Chest congestion 03/01/2020   Nausea 03/01/2020   History of 2019 novel coronavirus disease (COVID-19) 02/02/2020   Moderate asthma without complication 02/02/2020   Congestive heart failure (HCC) 02/02/2020   Former cigar smoker 02/02/2020   Acute kidney injury due to COVID-19 [facility]) 12/18/2019   AKI (acute kidney injury) (HCC)  12/17/2019   Pneumonia due to COVID-19 virus 12/17/2019   Dehydration 12/17/2019   Chronic idiopathic constipation 04/18/2019   Encounter for colorectal cancer screening 04/18/2019   History of colonic polyps 04/18/2019   Constipation 04/18/2019   Long-term use of high-risk medication 07/07/2018   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   Cervical stenosis of spinal canal 03/29/2017   Chronic pain disorder 03/29/2017   COPD (chronic obstructive pulmonary disease) (HCC) 03/29/2017   GERD (gastroesophageal reflux disease) 03/29/2017   H/O: stroke with residual effects 03/29/2017   Hyperlipidemia, unspecified 03/29/2017   Lumbar spondylosis 03/29/2017   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  Social History   Tobacco Use   Smoking status: Former   Smokeless tobacco: Never  Vaping  Use   Vaping Use: Never used  Substance Use Topics   Alcohol use: Not Currently   Drug use: Never    Home Medications Prior to Admission medications   Medication Sig Start Date End Date Taking? Authorizing Provider  acetaminophen (TYLENOL) 500 MG tablet Take 2 tablets (1,000 mg total) by mouth 2 (two) times daily as needed. 02/01/21  Yes Azzie Glatter, FNP  albuterol (PROVENTIL) (2.5 MG/3ML) 0.083% nebulizer solution Take 3 mLs (2.5 mg total) by nebulization every 6 (six) hours as needed for wheezing or shortness of breath. 07/30/21  Yes Vevelyn Francois, NP  albuterol (VENTOLIN HFA) 108 (90 Base) MCG/ACT inhaler Inhale 2 puffs into the lungs every 4 (four) hours as needed for wheezing or shortness of breath. 09/06/21  Yes Vevelyn Francois, NP  atorvastatin  (LIPITOR) 10 MG tablet Take 1 tablet (10 mg total) by mouth daily. 09/06/21  Yes Vevelyn Francois, NP  benzonatate (TESSALON) 100 MG capsule Take 1 capsule (100 mg total) by mouth 2 (two) times daily as needed for cough. 11/01/21  Yes King, Diona Foley, NP  benzonatate (TESSALON) 100 MG capsule Take 1 capsule (100 mg total) by mouth every 8 (eight) hours. 11/07/21  Yes Andraya Frigon, PA-C  feeding supplement, ENSURE ENLIVE, (ENSURE ENLIVE) LIQD Take 237 mLs by mouth 2 (two) times daily between meals. 01/29/20  Yes Azzie Glatter, FNP  fluticasone (FLONASE) 50 MCG/ACT nasal spray SHAKE LIQUID AND USE 2 SPRAYS IN EACH NOSTRIL DAILY Patient taking differently: Place 2 sprays into both nostrils daily. 09/06/21  Yes King, Diona Foley, NP  Fluticasone-Salmeterol (ADVAIR) 100-50 MCG/DOSE AEPB Inhale 1 puff into the lungs 2 (two) times daily. 08/31/20  Yes Azzie Glatter, FNP  losartan-hydrochlorothiazide (HYZAAR) 50-12.5 MG tablet Take 1 tablet by mouth daily. 09/06/21  Yes Vevelyn Francois, NP  lubiprostone (AMITIZA) 24 MCG capsule Take 1 capsule (24 mcg total) by mouth 2 (two) times daily with a meal. 09/06/21  Yes Vevelyn Francois, NP  montelukast (SINGULAIR) 10 MG tablet Take 1 tablet (10 mg total) by mouth at bedtime. 09/06/21  Yes King, Diona Foley, NP  ondansetron (ZOFRAN-ODT) 4 MG disintegrating tablet Take 1 tablet (4 mg total) by mouth every 8 (eight) hours as needed for nausea or vomiting. 11/07/21  Yes Mayreli Alden, PA-C  oseltamivir (TAMIFLU) 75 MG capsule Take 1 capsule (75 mg total) by mouth every 12 (twelve) hours. 11/07/21  Yes Lorine Iannaccone, PA-C  potassium chloride SA (KLOR-CON) 20 MEQ tablet Take 1 tablet (20 mEq total) by mouth daily. 11/03/21  Yes Vevelyn Francois, NP  cyclobenzaprine (FLEXERIL) 10 MG tablet Take 1 tablet (10 mg total) by mouth 3 (three) times daily as needed for muscle spasms. Patient not taking: Reported on 11/01/2021 02/01/21   Azzie Glatter, FNP  guaiFENesin  (MUCINEX) 600 MG 12 hr tablet Take 1 tablet (600 mg total) by mouth 2 (two) times daily. Patient not taking: Reported on 11/01/2021 02/10/21   Azzie Glatter, FNP  meloxicam (MOBIC) 7.5 MG tablet Take 1 tablet (7.5 mg total) by mouth daily. Patient not taking: Reported on 11/07/2021 11/01/21   Vevelyn Francois, NP  Nebulizer MISC 1 each by Does not apply route every 6 (six) hours as needed. 01/29/20   Azzie Glatter, FNP  ofloxacin (OCUFLOX) 0.3 % ophthalmic solution  10/13/21   [provider]  omeprazole (PRILOSEC) 20 MG capsule Take 1 capsule (20 mg total) by mouth daily. Patient not taking: Reported  on 11/01/2021 09/06/21   Vevelyn Francois, NP  ondansetron (ZOFRAN) 4 MG tablet Take 1 tablet (4 mg total) by mouth every 6 (six) hours as needed for nausea. Patient not taking: Reported on 09/28/2020 08/26/20   Azzie Glatter, FNP  sodium chloride (OCEAN) 0.65 % SOLN nasal spray Place 1 spray into both nostrils as needed for congestion. Patient not taking: Reported on 09/28/2020 08/26/20   Azzie Glatter, FNP  umeclidinium-vilanterol [facility] ELLIPTA) 62.5-25 MCG/INH AEPB Inhale into the lungs. 10/04/18   [provider]  Vitamin D, Ergocalciferol, (DRISDOL) 1.25 MG (50000 UNIT) CAPS capsule Take 1 capsule (50,000 Units total) by mouth every 7 (seven) days. Patient not taking: Reported on 02/26/2020 02/04/20   Azzie Glatter, FNP    Allergies    Patient has no known allergies.  Review of Systems   Review of Systems  Respiratory:  Positive for cough.   Gastrointestinal:  Positive for diarrhea, nausea and vomiting.  Musculoskeletal:  Positive for myalgias.  Neurological:  Positive for dizziness and light-headedness.  All other systems reviewed and are negative.  Physical Exam Updated Vital Signs BP 93/65   Pulse 61   Temp 98.7 F (37.1 C) (Oral)   Resp 18   SpO2 96%   Physical Exam Vitals and nursing note reviewed.  Constitutional:      General: She is not in  acute distress.    Appearance: Normal appearance.     Comments: Nontoxic  HENT:     Head: Normocephalic and atraumatic.  Eyes:     Conjunctiva/sclera: Conjunctivae normal.     Pupils: Pupils are equal, round, and reactive to light.  Cardiovascular:     Rate and Rhythm: Normal rate and regular rhythm.     Pulses: Normal pulses.  Pulmonary:     Effort: Pulmonary effort is normal. No respiratory distress.     Breath sounds: Normal breath sounds. No wheezing.     Comments: Speaking in full sentences.  Clear lung sounds in all fields. Abdominal:     General: There is no distension.     Palpations: Abdomen is soft. There is no mass.     Tenderness: There is no abdominal tenderness. There is no guarding or rebound.     Comments: No ttp of the abd  Musculoskeletal:        General: Normal range of motion.     Cervical back: Normal range of motion and neck supple.     Right lower leg: No edema.     Left lower leg: No edema.  Skin:    General: Skin is warm and dry.     Capillary Refill: Capillary refill takes less than 2 seconds.  Neurological:     Mental Status: She is alert and oriented to person, place, and time.  Psychiatric:        Mood and Affect: Mood and affect normal.        Speech: Speech normal.        Behavior: Behavior normal.    ED Results / Procedures / Treatments   Labs (all labs ordered are listed, but only abnormal results are displayed) Labs Reviewed  RESP PANEL BY RT-PCR (FLU A&B, COVID) ARPGX2 - Abnormal; Notable for the following components:      Result Value   Influenza A by PCR POSITIVE (*)    All other components within normal limits  COMPREHENSIVE METABOLIC PANEL - Abnormal; Notable for the following components:   Creatinine, Ser 1.36 (*)  Calcium 11.1 (*)    GFR, Estimated 42 (*)    All other components within normal limits  APTT - Abnormal; Notable for the following components:   aPTT 22 (*)    All other components within normal limits   URINALYSIS, ROUTINE W REFLEX MICROSCOPIC - Abnormal; Notable for the following components:   Color, Urine AMBER (*)    APPearance HAZY (*)    Hgb urine dipstick SMALL (*)    Ketones, ur 5 (*)    Protein, ur 30 (*)    Bacteria, UA MANY (*)    All other components within normal limits  CULTURE, BLOOD (ROUTINE X 2)  CULTURE, BLOOD (ROUTINE X 2)  LACTIC ACID, PLASMA  CBC WITH DIFFERENTIAL/PLATELET  PROTIME-INR  LIPASE, BLOOD    EKG EKG Interpretation  Date/Time:  Sunday November 07 2021 12:15:10 EST Ventricular Rate:  69 PR Interval:  162 QRS Duration: 76 QT Interval:  382 QTC Calculation: 409 R Axis:   61 Text Interpretation: Normal sinus rhythm Septal infarct , age undetermined Abnormal ECG Confirmed by Godfrey Pick [phone]) on 11/07/2021 3:10:37 PM  Radiology DG Chest Port 1 View  Result Date: 11/07/2021 CLINICAL DATA:  "Pt reports nausea, sweats, productive cough with white phlegm, and body aches x 2 days. Hypotensive at triage." EXAM: PORTABLE CHEST 1 VIEW COMPARISON:  12/19/2019 and older studies. FINDINGS: Cardiac silhouette is normal in size. Stable left anterior chest wall dual lead pacemaker. No mediastinal or hilar masses. Lungs are hyperexpanded, but clear. No pleural effusion or pneumothorax. Skeletal structures are grossly intact. IMPRESSION: No active disease. Electronically Signed   By: Lajean Manes M.D.   On: 11/07/2021 13:45    Procedures Procedures   Medications Ordered in ED Medications  sodium chloride 0.9 % bolus 500 mL (0 mLs Intravenous Stopped 11/07/21 1350)  ondansetron (ZOFRAN) injection 4 mg (4 mg Intravenous Given 11/07/21 1452)  acetaminophen (TYLENOL) tablet 650 mg (650 mg Oral Given 11/07/21 1452)    ED Course  I have reviewed the triage vital signs and the nursing notes.  Pertinent labs & imaging results that were available during my care of the patient were reviewed by me and considered in my medical decision making (see chart for  details).    MDM Rules/Calculators/A&P                           Patient presenting for evaluation of cough, body aches, nausea, vomiting, diarrhea.  On exam, patient appears nontoxic.  She was brought back to the room immediately due to hypotension, however rechecked before food initiation shows improved blood pressure.  Will obtain labs, urine, chest x-ray, respiratory panel.  question sepsis as patient is afebrile, not tachycardic, and BP stable. Likely viral illness and slight dehydration. Will follow labs.   Labs interpreted by me, overall reassuring.  No leukocytosis.  Lactic is negative.  Creatinine is similar to previous.  Gentle fluids given, however no large fluid bolus due to history of heart failure.  Urine shows blood and bacteria, however there are also squamous cells.  No nitrites or leukocytes.  Without urinary symptoms, doubt UTI.  Not sepsis.  Respiratory panel positive for flu.  This likely cause for her symptoms.  Discussed findings with patient and son.  Discussed continued symptomatic treatment.  Offered Tamiflu, patient accepted.  Discussed side effects.  Discussed close monitoring of respiratory status.  Discussed continued supportive care in addition.  At this time, patient appears safe  for discharge.  Return precautions given.  Patient states she understands and agrees to plan.   Final Clinical Impression(s) / ED Diagnoses Final diagnoses:  Flu    Rx / DC Orders ED Discharge Orders          Ordered    ondansetron (ZOFRAN-ODT) 4 MG disintegrating tablet  Every 8 hours PRN        11/07/21 1604    oseltamivir (TAMIFLU) 75 MG capsule  Every 12 hours        11/07/21 1604    benzonatate (TESSALON) 100 MG capsule  Every 8 hours        11/07/21 1605             Kimila Papaleo, PA-C 11/07/21 1651    Godfrey Pick, MD 11/09/21 [phone]

## 2021-11-07 NOTE — ED Provider Notes (Signed)
Emergency Medicine Provider Triage Evaluation Note  Ethan Kasperski , a 70 y.o. female  was evaluated in triage.  Pt complains of cold sxs.  Review of Systems  Positive: Cough, body aches, nausea Negative: N/v/d, dysuria  Physical Exam  BP (!) 89/70   Pulse 65   Temp 98.9 F (37.2 C) (Oral)   Resp 17   SpO2 98%  Gen:   Awake, no distress   Resp:  Normal effort  MSK:   Moves extremities without difficulty  Other:    Medical Decision Making  Medically screening exam initiated at 12:25 PM.  Appropriate orders placed.  Tanayah Squitieri was informed that the remainder of the evaluation will be completed by another provider, this initial triage assessment does not replace that evaluation, and the importance of remaining in the ED until their evaluation is complete.  Cold sxs x 2 days.     Fayrene Helper, PA-C 11/07/21 1227    Terald Sleeper, MD 11/07/21 1254

## 2021-11-07 NOTE — ED Triage Notes (Addendum)
Pt reports nausea, sweats, productive cough with white phlegm, and body aches x 2 days.  Hypotensive at triage.

## 2021-11-12 LAB — CULTURE, BLOOD (ROUTINE X 2)
Culture: NO GROWTH
Culture: NO GROWTH
Special Requests: ADEQUATE

## 2021-12-08 ENCOUNTER — Other Ambulatory Visit: Payer: Self-pay

## 2021-12-08 DIAGNOSIS — E876 Hypokalemia: Secondary | ICD-10-CM

## 2021-12-08 MED ORDER — POTASSIUM CHLORIDE CRYS ER 20 MEQ PO TBCR: 20.0000 meq | EXTENDED_RELEASE_TABLET | Freq: Every day | ORAL | 1 refills | Status: DC

## 2021-12-10 ENCOUNTER — Other Ambulatory Visit: Payer: Medicare Other

## 2021-12-15 MED ORDER — MELOXICAM 7.5 MG PO TABS: 7.5000 mg | ORAL_TABLET | Freq: Every day | ORAL | 0 refills | Status: AC

## 2021-12-23 ENCOUNTER — Telehealth: Payer: Self-pay

## 2021-12-23 DIAGNOSIS — R0602 Shortness of breath: Secondary | ICD-10-CM

## 2021-12-23 DIAGNOSIS — R059 Cough, unspecified: Secondary | ICD-10-CM

## 2021-12-23 DIAGNOSIS — I509 Heart failure, unspecified: Secondary | ICD-10-CM

## 2021-12-23 DIAGNOSIS — J45909 Unspecified asthma, uncomplicated: Secondary | ICD-10-CM

## 2021-12-23 NOTE — Telephone Encounter (Signed)
Albuterol  [facility]  [address]  [phone]

## 2021-12-31 MED ORDER — ALBUTEROL SULFATE (2.5 MG/3ML) 0.083% IN NEBU: 2.5000 mg | INHALATION_SOLUTION | Freq: Four times a day (QID) | RESPIRATORY_TRACT | 11 refills | Status: DC | PRN

## 2021-12-31 MED ORDER — ALBUTEROL SULFATE HFA 108 (90 BASE) MCG/ACT IN AERS: 2.0000 | INHALATION_SPRAY | RESPIRATORY_TRACT | 11 refills | Status: AC | PRN

## 2021-12-31 NOTE — Telephone Encounter (Signed)
Patient has 11 RF on albuterol from September, 2022. Should call pharmacy for refills.   ______________________________________

## 2021-12-31 NOTE — Addendum Note (Signed)
Addended by: Carilyn Goodpasture on: 12/31/2021 03:04 PM   Modules accepted: Orders

## 2022-02-02 ENCOUNTER — Ambulatory Visit (INDEPENDENT_AMBULATORY_CARE_PROVIDER_SITE_OTHER): Payer: Medicare Other | Admitting: Nurse Practitioner

## 2022-02-02 ENCOUNTER — Encounter: Payer: Self-pay | Admitting: Nurse Practitioner

## 2022-02-02 ENCOUNTER — Other Ambulatory Visit: Payer: Self-pay

## 2022-02-02 VITALS — BP 138/80 | HR 68 | Temp 97.0°F | Ht 64.0 in | Wt 155.0 lb

## 2022-02-02 DIAGNOSIS — R059 Cough, unspecified: Secondary | ICD-10-CM

## 2022-02-02 DIAGNOSIS — E785 Hyperlipidemia, unspecified: Secondary | ICD-10-CM

## 2022-02-02 DIAGNOSIS — J449 Chronic obstructive pulmonary disease, unspecified: Secondary | ICD-10-CM

## 2022-02-02 DIAGNOSIS — I10 Essential (primary) hypertension: Secondary | ICD-10-CM | POA: Diagnosis not present

## 2022-02-02 DIAGNOSIS — E559 Vitamin D deficiency, unspecified: Secondary | ICD-10-CM | POA: Diagnosis not present

## 2022-02-02 DIAGNOSIS — E538 Deficiency of other specified B group vitamins: Secondary | ICD-10-CM

## 2022-02-02 DIAGNOSIS — R0602 Shortness of breath: Secondary | ICD-10-CM

## 2022-02-02 DIAGNOSIS — Z1329 Encounter for screening for other suspected endocrine disorder: Secondary | ICD-10-CM

## 2022-02-02 DIAGNOSIS — Z23 Encounter for immunization: Secondary | ICD-10-CM

## 2022-02-02 DIAGNOSIS — I509 Heart failure, unspecified: Secondary | ICD-10-CM

## 2022-02-02 DIAGNOSIS — R1011 Right upper quadrant pain: Secondary | ICD-10-CM

## 2022-02-02 DIAGNOSIS — R5383 Other fatigue: Secondary | ICD-10-CM

## 2022-02-02 MED ORDER — BENZONATATE 100 MG PO CAPS: 100.0000 mg | ORAL_CAPSULE | Freq: Two times a day (BID) | ORAL | 1 refills | Status: AC | PRN

## 2022-02-02 MED ORDER — ACETAMINOPHEN-CODEINE 300-30 MG PO TABS
1.0000 | ORAL_TABLET | ORAL | 0 refills | Status: AC | PRN
Start: 2022-02-02 — End: ?

## 2022-02-02 NOTE — Patient Instructions (Signed)
Xrays to be completed at [facility] hours  [phone] Imaging Scheduling  [phone]    Cholelithiasis Cholelithiasis happens when gallstones form in the gallbladder. The gallbladder stores bile. Bile is a fluid that helps digest fats. Bile can harden and form into gallstones. If they cause a blockage, they can cause pain (gallbladder attack). What are the causes? This condition may be caused by: Some blood diseases, such as sickle cell anemia. Too much of a fat-like substance (cholesterol) in your bile. Not enough bile salts in your bile. These salts help the body absorb and digest fats. The gallbladder not emptying fully or often enough. This is common in pregnant women. What increases the risk? The following factors may make you more likely to develop this condition: Being female. Being pregnant many times. Eating a lot of fried foods, fat, and refined carbs (refined carbohydrates). Being very overweight (obese). Being older than age 33. Using medicines with female hormones in them for a long time. Losing weight fast. Having gallstones in your family. Having some health problems, such as diabetes, Crohn's disease, or liver disease. What are the signs or symptoms? Often, there may be gallstones but no symptoms. These gallstones are called silent gallstones. If a gallstone causes a blockage, you may get sudden pain. The pain: Can be in the upper right part of your belly (abdomen). Normally comes at night or after you eat. Can last an hour or more. Can spread to your right shoulder, back, or chest. Can feel like discomfort, burning, or fullness in the upper part of your belly (indigestion). If the blockage lasts more than a few hours, you can get an infection or swelling. You may: Feel like you may vomit. Vomit. Feel bloated. Have belly pain for 5 hours or more. Feel tender in your belly, often in the upper right part and under your  ribs. Have fever or chills. Have skin or the white parts of your eyes turn yellow (jaundice). Have dark pee (urine) or pale poop (stool). How is this treated? Treatment for this condition depends on how bad you feel. If you have symptoms, you may need: Home care, if symptoms are not very bad. Do not eat for 12-24 hours. Drink only water and clear liquids. Start to eat simple or clear foods after 1 or 2 days. Try broths and crackers. You may need medicines for pain or stomach upset or both. If you have an infection, you will need antibiotics. A hospital stay, if you have very bad pain or a very bad infection. Surgery to remove your gallbladder. You may need this if: Gallstones keep coming back. You have very bad symptoms. Medicines to break up gallstones. Medicines: Are best for small gallstones. May be used for up to 6-12 months. A procedure to find and take out gallstones or to break up gallstones. Follow these instructions at home: Medicines Take over-the-counter and prescription medicines only as told by your doctor. If you were prescribed an antibiotic medicine, take it as told by your doctor. Do not stop taking the antibiotic even if you start to feel better. Ask your doctor if the medicine prescribed to you requires you to avoid driving or using machinery. Eating and drinking Drink enough fluid to keep your urine pale yellow. Drink water or clear fluids. This is important when you have pain. Eat healthy foods. Choose: Fewer fatty foods, such as fried foods. Fewer refined carbs. Avoid breads and grains that  are highly processed, such as white bread and white rice. Choose whole grains, such as whole-wheat bread and brown rice. More fiber. Almonds, fresh fruit, and beans are healthy sources. General instructions Keep a healthy weight. Keep all follow-up visits as told by your doctor. This is important. Where to find more information [employer] of Diabetes and Digestive  and Kidney Diseases: [email or web address] Contact a doctor if: You have sudden pain in the upper right part of your belly. Pain might spread to your right shoulder, back, or chest. You have been diagnosed with gallstones that have no symptoms and you get: Belly pain. Discomfort, burning, or fullness in the upper part of your abdomen. You have dark urine or pale stools. Get help right away if: You have sudden pain in the upper right part of your abdomen, and the pain lasts more than 2 hours. You have pain in your abdomen, and: It lasts more than 5 hours. It keeps getting worse. You have a fever or chills. You keep feeling like you may vomit. You keep vomiting. Your skin or the white parts of your eyes turn yellow. Summary Cholelithiasis happens when gallstones form in the gallbladder. This condition may be caused by a blood disease, too much of a fat-like substance in the bile, or not enough bile salts in bile. Treatment for this condition depends on how bad you feel. If you have symptoms, do not eat or drink. You may need medicines. You may need a hospital stay for very bad pain or a very bad infection. You may need surgery if gallstones keep coming back or if you have very bad symptoms. This information is not intended to replace advice given to you by your health care provider. Make sure you discuss any questions you have with your health care provider. Document Revised: 01/17/2020 Document Reviewed: 10/21/2019 Elsevier Patient Education  [place].  Cough, Adult A cough helps to clear your throat and lungs. A cough may be a sign of an illness or another medical condition. An acute cough may only last 2-3 weeks, while a chronic cough may last 8 or more weeks. Many things can cause a cough. They include: Germs (viruses or bacteria) that attack the airway. Breathing in things that bother (irritate) your lungs. Allergies. Asthma. Mucus that runs down the back of your throat  (postnasal drip). Smoking. Acid backing up from the stomach into the tube that moves food from the mouth to the stomach (gastroesophageal reflux). Some medicines. Lung problems. Other medical conditions, such as heart failure or a blood clot in the lung (pulmonary embolism). Follow these instructions at home: Medicines Take over-the-counter and prescription medicines only as told by your doctor. Talk with your doctor before you take medicines that stop a cough (cough suppressants). Lifestyle  Do not smoke, and try not to be around smoke. Do not use any products that contain nicotine or tobacco, such as cigarettes, e-cigarettes, and chewing tobacco. If you need help quitting, ask your doctor. Drink enough fluid to keep your pee (urine) pale yellow. Avoid caffeine. Do not drink alcohol if your doctor tells you not to drink. General instructions  Watch for any changes in your cough. Tell your doctor about them. Always cover your mouth when you cough. Stay away from things that make you cough, such as perfume, candles, campfire smoke, or cleaning products. If the air is dry, use a cool mist vaporizer or humidifier in your home. If your cough is worse at night, try using extra  pillows to raise your head up higher while you sleep. Rest as needed. Keep all follow-up visits as told by your doctor. This is important. Contact a doctor if: You have new symptoms. You cough up pus. Your cough does not get better after 2-3 weeks, or your cough gets worse. Cough medicine does not help your cough and you are not sleeping well. You have pain that gets worse or pain that is not helped with medicine. You have a fever. You are losing weight and you do not know why. You have night sweats. Get help right away if: You cough up blood. You have trouble breathing. Your heartbeat is very fast. These symptoms may be an emergency. Do not wait to see if the symptoms will go away. Get medical help right away.  Call your local emergency services (911 in the [place] Do not drive yourself to the hospital. Summary A cough helps to clear your throat and lungs. Many things can cause a cough. Take over-the-counter and prescription medicines only as told by your doctor. Always cover your mouth when you cough. Contact a doctor if you have new symptoms or you have a cough that does not get better or gets worse. This information is not intended to replace advice given to you by your health care provider. Make sure you discuss any questions you have with your health care provider. Document Revised: 01/17/2020 Document Reviewed: 12/17/2018 Elsevier Patient Education  [place].

## 2022-02-02 NOTE — Progress Notes (Signed)
[place], [place]  [address] Phone:  [phone]   Fax:  [phone]   Established Patient Office Visit  Subjective:  Patient ID: Kylie Sanders, female    DOB: [birth year]  Age: 71 y.o. MRN: [id number]  CC:  Chief Complaint  Patient presents with   Follow-up    Pt is here for 3 month follow up. Pt stated she has right side pain and shortness of breath, coughing and thick white mucus. Pt need refill on all medications    HPI [facility] presents for follow up. She  has a past medical history of Asthma, CHF (congestive heart failure) ([place]), Diabetes mellitus without complication ([place]), History of 2019 novel coronavirus disease (COVID-19) (12/12/2019), Hyperlipidemia, Hyperlipidemia (01/2021), Hypertension, Hypokalemia (12/2019), Hypokalemia (01/2021), Myocardial infarction [facility]), Productive cough (01/2021), Seizures ([place]), Shortness of breath, Stroke ([place]), Vitamin D deficiency (01/2020), and Wheezes.   Kylie Sanders is in today for follow up for Hypertension. The current prescribed treatment is Hyzaar 50/12.5 mg daily.  Compliance is reported and home blood pressure monitoring is not  done. The  DASH diet is being followed. An exercise regimen is ongoing. There is a goal to feel better overall. She continues to have the chronic cough since COVID. Denies headache, dizziness, visual changes, shortness of breath, dyspnea on exertion, chest pain, nausea, vomiting or any edema.   She has right shoulder and abdomen and foot pain. She endorses a history of gall stones. She is using OTC medication.She lives with son. She reports a stumble but no falls.  She was encouraged to have images completed several months ago to evaluate her pain this has not been completed. She reports due to her transportation it is difficult. Her son has to get off early to bring her go her appointment.  Past Medical History:  Diagnosis Date   Asthma    CHF  (congestive heart failure) ([place])    Diabetes mellitus without complication ([place])    History of 2019 novel coronavirus disease (COVID-19) 12/12/2019   Hyperlipidemia    Hyperlipidemia 01/2021   Hypertension    Hypokalemia 12/2019   Hypokalemia 01/2021   Myocardial infarction ([place])    Productive cough 01/2021   Seizures ([place])    Shortness of breath    Stroke ([place])    Vitamin D deficiency 01/2020   Wheezes     Past Surgical History:  Procedure Laterality Date   PACEMAKER IMPLANT      History reviewed. No pertinent family history.  Social History   Socioeconomic History   Marital status: Single    Spouse name: Not on file   Number of children: Not on file   Years of education: Not on file   Highest education level: Not on file  Occupational History   Not on file  Tobacco Use   Smoking status: Former   Smokeless tobacco: Never  Vaping Use   Vaping Use: Never used  Substance and Sexual Activity   Alcohol use: Not Currently   Drug use: Never   Sexual activity: Not Currently  Other Topics Concern   Not on file  Social History Narrative   Not on file   Social Determinants of Health   Financial Resource Strain: Medium Risk   Difficulty of Paying Living Expenses: Somewhat hard  Food Insecurity: No Food Insecurity   Worried About Running Out of Food in the Last Year: Never true   Ran Out of Food in the  Last Year: Never true  Transportation Needs: No Transportation Needs   Lack of Transportation (Medical): No   Lack of Transportation (Non-Medical): No  Physical Activity: Insufficiently Active   Days of Exercise per Week: 7 days   Minutes of Exercise per Session: 10 min  Stress: No Stress Concern Present   Feeling of Stress : Only a little  Social Connections: Socially Isolated   Frequency of Communication with Friends and Family: Twice a week   Frequency of Social Gatherings with Friends and Family: Never   Attends Religious Services: Never   Therapist, music: No   Attends Music therapist: Never   Marital Status: Never married  Human resources officer Violence: Not At Risk   Fear of Current or Ex-Partner: No   Emotionally Abused: No   Physically Abused: No   Sexually Abused: No    Outpatient Medications Prior to Visit  Medication Sig Dispense Refill   acetaminophen (TYLENOL) 500 MG tablet Take 2 tablets (1,000 mg total) by mouth 2 (two) times daily as needed. 120 tablet 12   albuterol (PROVENTIL) (2.5 MG/3ML) 0.083% nebulizer solution Take 3 mLs (2.5 mg total) by nebulization every 6 (six) hours as needed for wheezing or shortness of breath. 75 mL 11   albuterol (VENTOLIN HFA) 108 (90 Base) MCG/ACT inhaler Inhale 2 puffs into the lungs every 4 (four) hours as needed for wheezing or shortness of breath. 18 g 11   atorvastatin (LIPITOR) 10 MG tablet Take 1 tablet (10 mg total) by mouth daily. 90 tablet 3   feeding supplement, ENSURE ENLIVE, (ENSURE ENLIVE) LIQD Take 237 mLs by mouth 2 (two) times daily between meals. 237 mL 12   fluticasone (FLONASE) 50 MCG/ACT nasal spray SHAKE LIQUID AND USE 2 SPRAYS IN EACH NOSTRIL DAILY (Patient taking differently: Place 2 sprays into both nostrils daily.) 48 g 3   Fluticasone-Salmeterol (ADVAIR) 100-50 MCG/DOSE AEPB Inhale 1 puff into the lungs 2 (two) times daily. 1 each 11   losartan-hydrochlorothiazide (HYZAAR) 50-12.5 MG tablet Take 1 tablet by mouth daily. 90 tablet 3   lubiprostone (AMITIZA) 24 MCG capsule Take 1 capsule (24 mcg total) by mouth 2 (two) times daily with a meal. 60 capsule 11   meloxicam (MOBIC) 7.5 MG tablet Take 1 tablet (7.5 mg total) by mouth daily. 30 tablet 0   montelukast (SINGULAIR) 10 MG tablet Take 1 tablet (10 mg total) by mouth at bedtime. 30 tablet 11   Nebulizer MISC 1 each by Does not apply route every 6 (six) hours as needed. 1 each 0   ofloxacin (OCUFLOX) 0.3 % ophthalmic solution      ondansetron (ZOFRAN-ODT) 4 MG disintegrating tablet Take 1  tablet (4 mg total) by mouth every 8 (eight) hours as needed for nausea or vomiting. 15 tablet 0   potassium chloride SA (KLOR-CON M) 20 MEQ tablet Take 1 tablet (20 mEq total) by mouth daily. 30 tablet 1   umeclidinium-vilanterol (ANORO ELLIPTA) 62.5-25 MCG/INH AEPB Inhale into the lungs.     benzonatate (TESSALON) 100 MG capsule Take 1 capsule (100 mg total) by mouth 2 (two) times daily as needed for cough. 20 capsule 1   benzonatate (TESSALON) 100 MG capsule Take 1 capsule (100 mg total) by mouth every 8 (eight) hours. 21 capsule 0   oseltamivir (TAMIFLU) 75 MG capsule Take 1 capsule (75 mg total) by mouth every 12 (twelve) hours. 10 capsule 0   cyclobenzaprine (FLEXERIL) 10 MG tablet Take 1 tablet (  10 mg total) by mouth 3 (three) times daily as needed for muscle spasms. (Patient not taking: Reported on 02/02/2022) 90 tablet 3   guaiFENesin (MUCINEX) 600 MG 12 hr tablet Take 1 tablet (600 mg total) by mouth 2 (two) times daily. (Patient not taking: Reported on 11/01/2021) 60 tablet 1   omeprazole (PRILOSEC) 20 MG capsule Take 1 capsule (20 mg total) by mouth daily. (Patient not taking: Reported on 11/01/2021) 90 capsule 6   ondansetron (ZOFRAN) 4 MG tablet Take 1 tablet (4 mg total) by mouth every 6 (six) hours as needed for nausea. (Patient not taking: Reported on 09/28/2020) 30 tablet 3   sodium chloride (OCEAN) 0.65 % SOLN nasal spray Place 1 spray into both nostrils as needed for congestion. (Patient not taking: Reported on 09/28/2020) 30 mL 11   Vitamin D, Ergocalciferol, (DRISDOL) 1.25 MG (50000 UNIT) CAPS capsule Take 1 capsule (50,000 Units total) by mouth every 7 (seven) days. (Patient not taking: Reported on 02/26/2020) 5 capsule 6   No facility-administered medications prior to visit.    No Known Allergies  ROS Review of Systems  HENT:         Ongoing mucous     Objective:    Physical Exam Constitutional:      General: She is not in acute distress.    Appearance: She is not  ill-appearing, toxic-appearing or diaphoretic.  HENT:     Head: Normocephalic and atraumatic.     Nose: Nose normal.     Mouth/Throat:     Mouth: Mucous membranes are moist.  Cardiovascular:     Rate and Rhythm: Normal rate and regular rhythm.     Pulses: Normal pulses.     Heart sounds: Normal heart sounds.  Pulmonary:     Effort: Pulmonary effort is normal.     Comments: diminshed Abdominal:     Palpations: Abdomen is soft.     Comments: hypoactive  Musculoskeletal:        General: Tenderness present.     Cervical back: Normal range of motion.     Right lower leg: No edema.     Left lower leg: No edema.     Comments: Adequate ROM.  Skin:    General: Skin is warm and dry.     Capillary Refill: Capillary refill takes less than 2 seconds.  Neurological:     Mental Status: She is alert.    BP 138/80    Pulse 68    Temp (!) 97 F (36.1 C)    Ht 5' 4"  (1.626 m)    Wt 155 lb 0.6 oz (70.3 kg)    BMI 26.61 kg/m  Wt Readings from Last 3 Encounters:  02/02/22 155 lb 0.6 oz (70.3 kg)  11/01/21 159 lb (72.1 kg)  04/29/21 163 lb (73.9 kg)     Health Maintenance Due  Topic Date Due   COVID-19 Vaccine (1) Never done   Zoster Vaccines- Shingrix (1 of 2) Never done    There are no preventive care reminders to display for this patient.  Lab Results  Component Value Date   TSH 1.140 02/02/2022   Lab Results  Component Value Date   WBC 5.0 02/02/2022   HGB 11.1 02/02/2022   HCT 34.0 02/02/2022   MCV 85 02/02/2022   PLT 229 02/02/2022   Lab Results  Component Value Date   NA 140 02/02/2022   K 3.7 02/02/2022   CO2 24 11/07/2021   GLUCOSE 91 02/02/2022  BUN 16 02/02/2022   CREATININE 1.10 (H) 02/02/2022   BILITOT 0.4 02/02/2022   ALKPHOS 78 02/02/2022   AST 10 02/02/2022   ALT 14 11/07/2021   PROT 6.8 02/02/2022   ALBUMIN 3.9 02/02/2022   CALCIUM 12.3 (H) 02/02/2022   ANIONGAP 7 11/07/2021   EGFR 55 (L) 02/02/2022   Lab Results  Component Value Date   CHOL  171 02/02/2022   Lab Results  Component Value Date   HDL 76 02/02/2022   Lab Results  Component Value Date   LDLCALC 78 02/02/2022   Lab Results  Component Value Date   TRIG 94 02/02/2022   Lab Results  Component Value Date   CHOLHDL 2.3 02/02/2022   No results found for: HGBA1C    Assessment & Plan:   Problem List Items Addressed This Visit       Cardiovascular and Mediastinum   Congestive heart failure (HCC) Stable  Weight maintained Treatment compliance      Respiratory   COPD (chronic obstructive pulmonary disease) (HCC) Cough Will refill benzonatate  Recommendation to establish with a pulmonology   Relevant Medications   benzonatate (TESSALON) 100 MG capsule     Other      Hyperlipidemia, unspecified Continue with current regimen.  No changes warranted. Good patient compliance.'   Relevant Orders   Lipid panel (Completed)   Other Visit Diagnoses     Hypertension, unspecified type    Stable Encouraged on going compliance with current medication regimen Encouraged home monitoring and recording BP <130/80 Eating a heart-healthy diet with less salt Encouraged regular physical activity  Recommend Weight loss      Relevant Orders   Comp. Metabolic Panel (12) (Completed)         Screening for thyroid disorder       Relevant Orders   TSH (Completed)   Fatigue, unspecified type       Relevant Orders   CBC with Differential/Platelet (Completed)   Cough, unspecified type       Relevant Medications   benzonatate (TESSALON) 100 MG capsule   RUQ abdominal pain     Persistent  Evaluation encouraged   Relevant Orders   DG Abd 2 Views       Meds ordered this encounter  Medications   benzonatate (TESSALON) 100 MG capsule    Sig: Take 1 capsule (100 mg total) by mouth 2 (two) times daily as needed for cough.    Dispense:  30 capsule    Refill:  1    Order Specific Question:   Supervising Provider    Answer:   Tresa Garter [[id number]]    Acetaminophen-Codeine (TYLENOL/CODEINE #3) 300-30 MG tablet    Sig: Take 1 tablet by mouth every 4 (four) hours as needed for pain.    Dispense:  30 tablet    Refill:  0    Order Specific Question:   Supervising Provider    Answer:   Tresa Garter [id number]    Follow-up: Return in about 4 months (around 06/02/2022).    Vevelyn Francois, NP

## 2022-02-03 LAB — COMP. METABOLIC PANEL (12)
AST: 10 IU/L (ref 0–40)
Albumin/Globulin Ratio: 1.3 (ref 1.2–2.2)
Albumin: 3.9 g/dL (ref 3.8–4.8)
Alkaline Phosphatase: 78 IU/L (ref 44–121)
BUN/Creatinine Ratio: 15 (ref 12–28)
BUN: 16 mg/dL (ref 8–27)
Bilirubin Total: 0.4 mg/dL (ref 0.0–1.2)
Calcium: 12.3 mg/dL — ABNORMAL HIGH (ref 8.7–10.3)
Chloride: 105 mmol/L (ref 96–106)
Creatinine, Ser: 1.1 mg/dL — ABNORMAL HIGH (ref 0.57–1.00)
Globulin, Total: 2.9 g/dL (ref 1.5–4.5)
Glucose: 91 mg/dL (ref 70–99)
Potassium: 3.7 mmol/L (ref 3.5–5.2)
Sodium: 140 mmol/L (ref 134–144)
Total Protein: 6.8 g/dL (ref 6.0–8.5)
eGFR: 55 mL/min/{1.73_m2} — ABNORMAL LOW (ref >59)

## 2022-02-03 LAB — CBC WITH DIFFERENTIAL/PLATELET
Basophils Absolute: 0 10*3/uL (ref 0.0–0.2)
Basos: 1 %
EOS (ABSOLUTE): 0.1 10*3/uL (ref 0.0–0.4)
Eos: 2 %
Hematocrit: 34 % (ref 34.0–46.6)
Hemoglobin: 11.1 g/dL (ref 11.1–15.9)
Immature Grans (Abs): 0 10*3/uL (ref 0.0–0.1)
Immature Granulocytes: 0 %
Lymphocytes Absolute: 1.9 10*3/uL (ref 0.7–3.1)
Lymphs: 39 %
MCH: 27.8 pg (ref 26.6–33.0)
MCHC: 32.6 g/dL (ref 31.5–35.7)
MCV: 85 fL (ref 79–97)
Monocytes Absolute: 0.4 10*3/uL (ref 0.1–0.9)
Monocytes: 8 %
Neutrophils Absolute: 2.5 10*3/uL (ref 1.4–7.0)
Neutrophils: 50 %
Platelets: 229 10*3/uL (ref 150–450)
RBC: 4 x10E6/uL (ref 3.77–5.28)
RDW: 13.1 % (ref 11.7–15.4)
WBC: 5 10*3/uL (ref 3.4–10.8)

## 2022-02-03 LAB — LIPID PANEL
Chol/HDL Ratio: 2.3 ratio (ref 0.0–4.4)
Cholesterol, Total: 171 mg/dL (ref 100–199)
HDL: 76 mg/dL (ref >39)
LDL Chol Calc (NIH): 78 mg/dL (ref 0–99)
Triglycerides: 94 mg/dL (ref 0–149)
VLDL Cholesterol Cal: 17 mg/dL (ref 5–40)

## 2022-02-03 LAB — TSH: TSH: 1.14 u[IU]/mL (ref 0.450–4.500)

## 2022-02-07 ENCOUNTER — Other Ambulatory Visit: Payer: Self-pay

## 2022-02-07 DIAGNOSIS — R0602 Shortness of breath: Secondary | ICD-10-CM

## 2022-02-07 DIAGNOSIS — R059 Cough, unspecified: Secondary | ICD-10-CM

## 2022-02-07 DIAGNOSIS — I509 Heart failure, unspecified: Secondary | ICD-10-CM

## 2022-02-07 DIAGNOSIS — J45909 Unspecified asthma, uncomplicated: Secondary | ICD-10-CM

## 2022-02-07 MED ORDER — ALBUTEROL SULFATE (2.5 MG/3ML) 0.083% IN NEBU: 2.5000 mg | INHALATION_SOLUTION | Freq: Four times a day (QID) | RESPIRATORY_TRACT | 11 refills | Status: AC | PRN

## 2022-02-27 ENCOUNTER — Emergency Department (HOSPITAL_COMMUNITY)
Admission: EM | Admit: 2022-02-27 | Discharge: 2022-02-27 | Disposition: A | Discharge status: Home / Self Care | Payer: Medicare Other | Attending: Emergency Medicine | Admitting: Emergency Medicine

## 2022-02-27 ENCOUNTER — Encounter (HOSPITAL_COMMUNITY): Payer: Self-pay | Admitting: Emergency Medicine

## 2022-02-27 ENCOUNTER — Other Ambulatory Visit: Payer: Self-pay

## 2022-02-27 DIAGNOSIS — J45909 Unspecified asthma, uncomplicated: Secondary | ICD-10-CM | POA: Diagnosis not present

## 2022-02-27 DIAGNOSIS — K0889 Other specified disorders of teeth and supporting structures: Secondary | ICD-10-CM

## 2022-02-27 DIAGNOSIS — E119 Type 2 diabetes mellitus without complications: Secondary | ICD-10-CM | POA: Diagnosis not present

## 2022-02-27 DIAGNOSIS — Z7951 Long term (current) use of inhaled steroids: Secondary | ICD-10-CM | POA: Diagnosis not present

## 2022-02-27 DIAGNOSIS — I509 Heart failure, unspecified: Secondary | ICD-10-CM | POA: Diagnosis not present

## 2022-02-27 MED ORDER — AMOXICILLIN-POT CLAVULANATE 875-125 MG PO TABS: 1.0000 | ORAL_TABLET | Freq: Two times a day (BID) | ORAL | 0 refills | Status: DC

## 2022-02-27 NOTE — ED Provider Notes (Signed)
?[place] ?Provider Note ? ? ?CSN: [id number] ?Arrival date & time: 02/27/22  [id number] ? ?  ? ?History ? ?Chief Complaint  ?Patient presents with  ? Dental Pain  ? ? ?Kylie Sanders is a 71 y.o. female. ? ?Patient with history of asthma, CHF, and diabetes presents today with complaints of dental pain.  She states that same began 3 days ago to the right upper portion of her mouth after she chipped her tooth eating hard foods and has been persistent since.  She has not tried any medications for her symptoms.  States she is now having to eat with the left side of her mouth only due to pain.  She denies any fevers or chills, any trouble swallowing, chest pain, or shortness of breath.  He has not yet scheduled an appointment with a dentist. ? ?The history is provided by the patient. No language interpreter was used.  ?Dental Pain ?Associated symptoms: no facial swelling, no fever, no headaches and no oral lesions   ? ?  ? ?Home Medications ?Prior to Admission medications   ?Medication Sig Start Date End Date Taking? Authorizing Provider  ?acetaminophen (TYLENOL) 500 MG tablet Take 2 tablets (1,000 mg total) by mouth 2 (two) times daily as needed. 02/01/21   Azzie Glatter, FNP  ?Acetaminophen-Codeine (TYLENOL/CODEINE #3) 300-30 MG tablet Take 1 tablet by mouth every 4 (four) hours as needed for pain. 02/02/22   Vevelyn Francois, NP  ?albuterol (PROVENTIL) (2.5 MG/3ML) 0.083% nebulizer solution Take 3 mLs (2.5 mg total) by nebulization every 6 (six) hours as needed for wheezing or shortness of breath. 02/07/22   Vevelyn Francois, NP  ?albuterol (VENTOLIN HFA) 108 (90 Base) MCG/ACT inhaler Inhale 2 puffs into the lungs every 4 (four) hours as needed for wheezing or shortness of breath. 12/31/21   Vevelyn Francois, NP  ?atorvastatin (LIPITOR) 10 MG tablet Take 1 tablet (10 mg total) by mouth daily. 09/06/21   Vevelyn Francois, NP  ?benzonatate (TESSALON) 100 MG capsule Take 1 capsule (100  mg total) by mouth 2 (two) times daily as needed for cough. 02/02/22   Vevelyn Francois, NP  ?cyclobenzaprine (FLEXERIL) 10 MG tablet Take 1 tablet (10 mg total) by mouth 3 (three) times daily as needed for muscle spasms. ?Patient not taking: Reported on 02/02/2022 02/01/21   Azzie Glatter, FNP  ?feeding supplement, ENSURE ENLIVE, (ENSURE ENLIVE) LIQD Take 237 mLs by mouth 2 (two) times daily between meals. 01/29/20   Azzie Glatter, FNP  ?fluticasone (FLONASE) 50 MCG/ACT nasal spray SHAKE LIQUID AND USE 2 SPRAYS IN EACH NOSTRIL DAILY ?Patient taking differently: Place 2 sprays into both nostrils daily. 09/06/21   Vevelyn Francois, NP  ?Fluticasone-Salmeterol (ADVAIR) 100-50 MCG/DOSE AEPB Inhale 1 puff into the lungs 2 (two) times daily. 08/31/20   Azzie Glatter, FNP  ?guaiFENesin (MUCINEX) 600 MG 12 hr tablet Take 1 tablet (600 mg total) by mouth 2 (two) times daily. ?Patient not taking: Reported on 11/01/2021 02/10/21   Azzie Glatter, FNP  ?losartan-hydrochlorothiazide (HYZAAR) 50-12.5 MG tablet Take 1 tablet by mouth daily. 09/06/21   Vevelyn Francois, NP  ?lubiprostone (AMITIZA) 24 MCG capsule Take 1 capsule (24 mcg total) by mouth 2 (two) times daily with a meal. 09/06/21   Vevelyn Francois, NP  ?meloxicam (MOBIC) 7.5 MG tablet Take 1 tablet (7.5 mg total) by mouth daily. 12/15/21   Vevelyn Francois, NP  ?montelukast (SINGULAIR) 10  MG tablet Take 1 tablet (10 mg total) by mouth at bedtime. 09/06/21   Vevelyn Francois, NP  ?Nebulizer MISC 1 each by Does not apply route every 6 (six) hours as needed. 01/29/20   Azzie Glatter, FNP  ?ofloxacin (OCUFLOX) 0.3 % ophthalmic solution  10/13/21   [provider]  ?omeprazole (PRILOSEC) 20 MG capsule Take 1 capsule (20 mg total) by mouth daily. ?Patient not taking: Reported on 11/01/2021 09/06/21   Vevelyn Francois, NP  ?ondansetron (ZOFRAN) 4 MG tablet Take 1 tablet (4 mg total) by mouth every 6 (six) hours as needed for nausea. ?Patient not taking: Reported on  09/28/2020 08/26/20   Azzie Glatter, FNP  ?ondansetron (ZOFRAN-ODT) 4 MG disintegrating tablet Take 1 tablet (4 mg total) by mouth every 8 (eight) hours as needed for nausea or vomiting. 11/07/21   Caccavale, Sophia, PA-C  ?potassium chloride SA (KLOR-CON M) 20 MEQ tablet Take 1 tablet (20 mEq total) by mouth daily. 12/08/21   Vevelyn Francois, NP  ?sodium chloride (OCEAN) 0.65 % SOLN nasal spray Place 1 spray into both nostrils as needed for congestion. ?Patient not taking: Reported on 09/28/2020 08/26/20   Azzie Glatter, FNP  ?umeclidinium-vilanterol [facility] ELLIPTA) 62.5-25 MCG/INH AEPB Inhale into the lungs. 10/04/18   [provider]  ?Vitamin D, Ergocalciferol, (DRISDOL) 1.25 MG (50000 UNIT) CAPS capsule Take 1 capsule (50,000 Units total) by mouth every 7 (seven) days. ?Patient not taking: Reported on 02/26/2020 02/04/20   Azzie Glatter, FNP  ?   ? ?Allergies    ?Patient has no known allergies.   ? ?Review of Systems   ?Review of Systems  ?Constitutional:  Negative for chills and fever.  ?HENT:  Positive for dental problem. Negative for facial swelling, mouth sores and nosebleeds.   ?Eyes:  Negative for visual disturbance.  ?Respiratory:  Negative for cough and shortness of breath.   ?Cardiovascular:  Negative for chest pain.  ?Gastrointestinal:  Negative for abdominal pain, diarrhea, nausea and vomiting.  ?Neurological:  Negative for headaches.  ?All other systems reviewed and are negative. ? ?Physical Exam ?Updated Vital Signs ?BP (!) 165/115 (BP Location: Right Arm)   Pulse 88   Temp 98.6 ?F (37 ?C) (Oral)   Resp 16   SpO2 98%  ?Physical Exam ?Vitals and nursing note reviewed.  ?Constitutional:   ?   General: She is not in acute distress. ?   Appearance: Normal appearance. She is normal weight. She is not ill-appearing, toxic-appearing or diaphoretic.  ?   Comments: Patient resting comfortably in bed in no acute distress  ?HENT:  ?   Head: Normocephalic and atraumatic.  ?   Nose: Nose  normal.  ?   Mouth/Throat:  ?   Mouth: Mucous membranes are moist.  ?   Dentition: No gingival swelling or gum lesions.  ?   Tongue: No lesions. Tongue does not deviate from midline.  ?   Palate: No mass and lesions.  ?   Pharynx: Oropharynx is clear. Uvula midline.  ?   Tonsils: No tonsillar exudate or tonsillar abscesses.  ? ?   Comments: Dentition poor throughout the mouth with multiple dental caries.  Tenderness to palpation of the right upper molar with jagged edge present.  No obvious abscess, drainage, facial swelling, or signs of Ludwig's angina. ? ?Full ROM to jaw without trismus or crepitus ?Eyes:  ?   Extraocular Movements: Extraocular movements intact.  ?   Pupils: Pupils are equal, round,  and reactive to light.  ?Cardiovascular:  ?   Rate and Rhythm: Normal rate.  ?Pulmonary:  ?   Effort: Pulmonary effort is normal. No respiratory distress.  ?Musculoskeletal:     ?   General: Normal range of motion.  ?   Cervical back: Normal range of motion.  ?Skin: ?   General: Skin is warm and dry.  ?Neurological:  ?   General: No focal deficit present.  ?   Mental Status: She is alert.  ?Psychiatric:     ?   Mood and Affect: Mood normal.     ?   Behavior: Behavior normal.  ? ? ?ED Results / Procedures / Treatments   ?Labs ?(all labs ordered are listed, but only abnormal results are displayed) ?Labs Reviewed - No data to display ? ?EKG ?None ? ?Radiology ?No results found. ? ?Procedures ?Procedures  ? ? ?Medications Ordered in ED ?Medications - No data to display ? ?ED Course/ Medical Decision Making/ A&P ?  ?                        ?Medical Decision Making ? ?Patient with dental pain x3 days.  Patient is afebrile, nontoxic-appearing, and in no acute distress with reassuring vital signs.  Many dental caries present throughout the mouth, jagged edged molar present on the right upper side.  No nerve root visualized, no gross abscess or facial swelling present.  Exam unconcerning for Ludwig's angina or spread of  infection.  Will treat with penicillin and anti-inflammatories medicine.  Emphasized the importance of following up with a dentist for further evaluation and management of the patient's symptoms.  Educated patient on t

## 2022-02-27 NOTE — Discharge Instructions (Addendum)
As we discussed, I have written you a prescription for antibiotics for management of your tooth ache.  Please take entire course as prescribed.  I also recommend taking Tylenol regularly for pain.  It is extremely important that you follow-up with a dentist in the next few days for further evaluation and management.  ? ?Return if development of any new or worsening symptoms. ?

## 2022-02-27 NOTE — ED Triage Notes (Addendum)
Patient complains of right upper dental pain that started three days ago. Patient is afebrile in triage, alert, oriented, ambulatory, and in no apparent distress. ? ?History of hypertension, has not taken her routine medications yet today. ?

## 2022-04-14 ENCOUNTER — Ambulatory Visit (INDEPENDENT_AMBULATORY_CARE_PROVIDER_SITE_OTHER): Payer: Medicare Other | Admitting: Nurse Practitioner

## 2022-04-14 DIAGNOSIS — Z0000 Encounter for general adult medical examination without abnormal findings: Secondary | ICD-10-CM | POA: Diagnosis not present

## 2022-04-14 NOTE — Progress Notes (Signed)
? ?Subjective:  ? Kylie RobertsShirley Mae Oneta Sanders is a 71 y.o. female who presents for an Initial Medicare Annual Wellness Visit.  ? ?I connected with  Kylie GamblesShirley Mae Sanders on 04/14/22 by a  telephone  enabled telemedicine application and verified that I am speaking with the correct person using two identifiers. ? ?Patient Location: Home ? ?Provider Location: Office/Clinic ? ?I discussed the limitations of evaluation and management by telemedicine. The patient expressed understanding and agreed to proceed.  ? ?Review of Systems    ? ?  ? ?   ?Objective:  ?  ?There were no vitals filed for this visit. ?There is no height or weight on file to calculate BMI. ? ? ?  04/29/2021  ?  3:28 PM 12/17/2019  ? 12:51 PM  ?Advanced Directives  ?Does Patient Have a Medical Advance Directive? No No  ?Would patient like information on creating a medical advance directive? Yes (ED - Information included in AVS) No - Patient declined  ? ? ?Current Medications (verified) ?Outpatient Encounter Medications as of 04/14/2022  ?Medication Sig  ? acetaminophen (TYLENOL) 500 MG tablet Take 2 tablets (1,000 mg total) by mouth 2 (two) times daily as needed.  ? Acetaminophen-Codeine (TYLENOL/CODEINE #3) 300-30 MG tablet Take 1 tablet by mouth every 4 (four) hours as needed for pain.  ? albuterol (PROVENTIL) (2.5 MG/3ML) 0.083% nebulizer solution Take 3 mLs (2.5 mg total) by nebulization every 6 (six) hours as needed for wheezing or shortness of breath.  ? albuterol (VENTOLIN HFA) 108 (90 Base) MCG/ACT inhaler Inhale 2 puffs into the lungs every 4 (four) hours as needed for wheezing or shortness of breath.  ? amoxicillin-clavulanate (AUGMENTIN) 875-125 MG tablet Take 1 tablet by mouth every 12 (twelve) hours.  ? atorvastatin (LIPITOR) 10 MG tablet Take 1 tablet (10 mg total) by mouth daily.  ? benzonatate (TESSALON) 100 MG capsule Take 1 capsule (100 mg total) by mouth 2 (two) times daily as needed for cough.  ? cyclobenzaprine (FLEXERIL) 10 MG tablet Take 1 tablet  (10 mg total) by mouth 3 (three) times daily as needed for muscle spasms. (Patient not taking: Reported on 02/02/2022)  ? feeding supplement, ENSURE ENLIVE, (ENSURE ENLIVE) LIQD Take 237 mLs by mouth 2 (two) times daily between meals.  ? fluticasone (FLONASE) 50 MCG/ACT nasal spray SHAKE LIQUID AND USE 2 SPRAYS IN EACH NOSTRIL DAILY (Patient taking differently: Place 2 sprays into both nostrils daily.)  ? Fluticasone-Salmeterol (ADVAIR) 100-50 MCG/DOSE AEPB Inhale 1 puff into the lungs 2 (two) times daily.  ? guaiFENesin (MUCINEX) 600 MG 12 hr tablet Take 1 tablet (600 mg total) by mouth 2 (two) times daily. (Patient not taking: Reported on 11/01/2021)  ? losartan-hydrochlorothiazide (HYZAAR) 50-12.5 MG tablet Take 1 tablet by mouth daily.  ? lubiprostone (AMITIZA) 24 MCG capsule Take 1 capsule (24 mcg total) by mouth 2 (two) times daily with a meal.  ? meloxicam (MOBIC) 7.5 MG tablet Take 1 tablet (7.5 mg total) by mouth daily.  ? montelukast (SINGULAIR) 10 MG tablet Take 1 tablet (10 mg total) by mouth at bedtime.  ? Nebulizer MISC 1 each by Does not apply route every 6 (six) hours as needed.  ? ofloxacin (OCUFLOX) 0.3 % ophthalmic solution   ? omeprazole (PRILOSEC) 20 MG capsule Take 1 capsule (20 mg total) by mouth daily. (Patient not taking: Reported on 11/01/2021)  ? ondansetron (ZOFRAN) 4 MG tablet Take 1 tablet (4 mg total) by mouth every 6 (six) hours as needed for nausea. (Patient  not taking: Reported on 09/28/2020)  ? ondansetron (ZOFRAN-ODT) 4 MG disintegrating tablet Take 1 tablet (4 mg total) by mouth every 8 (eight) hours as needed for nausea or vomiting.  ? potassium chloride SA (KLOR-CON M) 20 MEQ tablet Take 1 tablet (20 mEq total) by mouth daily.  ? sodium chloride (OCEAN) 0.65 % SOLN nasal spray Place 1 spray into both nostrils as needed for congestion. (Patient not taking: Reported on 09/28/2020)  ? umeclidinium-vilanterol (ANORO ELLIPTA) 62.5-25 MCG/INH AEPB Inhale into the lungs.  ? Vitamin D,  Ergocalciferol, (DRISDOL) 1.25 MG (50000 UNIT) CAPS capsule Take 1 capsule (50,000 Units total) by mouth every 7 (seven) days. (Patient not taking: Reported on 02/26/2020)  ? ?No facility-administered encounter medications on file as of 04/14/2022.  ? ? ?Allergies (verified) ?Patient has no known allergies.  ? ?History: ?Past Medical History:  ?Diagnosis Date  ? Asthma   ? CHF (congestive heart failure) (HCC)   ? Diabetes mellitus without complication (HCC)   ? History of 2019 novel coronavirus disease (COVID-19) 12/12/2019  ? Hyperlipidemia   ? Hyperlipidemia 01/2021  ? Hypertension   ? Hypokalemia 12/2019  ? Hypokalemia 01/2021  ? Myocardial infarction [facility])   ? Productive cough 01/2021  ? Seizures (HCC)   ? Shortness of breath   ? Stroke [facility])   ? Vitamin D deficiency 01/2020  ? Wheezes   ? ?Past Surgical History:  ?Procedure Laterality Date  ? PACEMAKER IMPLANT    ? ?No family history on file. ?Social History  ? ?Socioeconomic History  ? Marital status: Single  ?  Spouse name: Not on file  ? Number of children: Not on file  ? Years of education: Not on file  ? Highest education level: Not on file  ?Occupational History  ? Not on file  ?Tobacco Use  ? Smoking status: Former  ? Smokeless tobacco: Never  ?Vaping Use  ? Vaping Use: Never used  ?Substance and Sexual Activity  ? Alcohol use: Not Currently  ? Drug use: Never  ? Sexual activity: Not Currently  ?Other Topics Concern  ? Not on file  ?Social History Narrative  ? Not on file  ? ?Social Determinants of Health  ? ?Financial Resource Strain: Medium Risk  ? Difficulty of Paying Living Expenses: Somewhat hard  ?Food Insecurity: No Food Insecurity  ? Worried About Programme researcher, broadcasting/film/video in the Last Year: Never true  ? Ran Out of Food in the Last Year: Never true  ?Transportation Needs: No Transportation Needs  ? Lack of Transportation (Medical): No  ? Lack of Transportation (Non-Medical): No  ?Physical Activity: Insufficiently Active  ? Days of Exercise per Week: 7  days  ? Minutes of Exercise per Session: 10 min  ?Stress: No Stress Concern Present  ? Feeling of Stress : Only a little  ?Social Connections: Socially Isolated  ? Frequency of Communication with Friends and Family: Twice a week  ? Frequency of Social Gatherings with Friends and Family: Never  ? Attends Religious Services: Never  ? Active Member of Clubs or Organizations: No  ? Attends Banker Meetings: Never  ? Marital Status: Never married  ? ? ?Tobacco Counseling ?Counseling given: Not Answered ? ? ?Clinical Intake: ? ?  ? ?  ? ?  ? ?  ? ?  ? ?Diabetic?Not a diabetic ? ?  ? ?  ? ? ?Activities of Daily Living ? ?  04/29/2021  ?  3:30 PM  ?In your present state of  health, do you have any difficulty performing the following activities:  ?Hearing? 0  ?Vision? 0  ?Difficulty concentrating or making decisions? 0  ?Walking or climbing stairs? 0  ?Dressing or bathing? 0  ?Doing errands, shopping? 0  ?Preparing Food and eating ? N  ?Using the Toilet? N  ?In the past six months, have you accidently leaked urine? N  ?Do you have problems with loss of bowel control? N  ?Managing your Medications? N  ?Managing your Finances? N  ?Housekeeping or managing your Housekeeping? N  ? ? ?Patient Care Team: ?Ivonne Andrew, NP as PCP - General (Adult Health Nurse Practitioner) ? ?Indicate any recent Medical Services you may have received from other than [facility] providers in the past year (date may be approximate). ? ?   ?Assessment:  ? This is a routine wellness examination for [place]. ? ?Hearing/Vision screen ?No results found. ? ?Dietary issues and exercise activities discussed: ?  ? ? Goals Addressed   ?None ?  ?Depression Screen ? ?  02/02/2022  ?  3:28 PM 04/29/2021  ?  3:20 PM 02/01/2021  ?  1:35 PM 09/28/2020  ?  4:06 PM 08/26/2020  ? 10:01 AM 05/27/2020  ?  3:07 PM  ?PHQ 2/9 Scores  ?PHQ - 2 Score 0 1 0 0 0 1  ?PHQ- 9 Score   0     ?Exception Documentation    Medical reason Medical reason   ?  ?Fall Risk ? ?  02/02/2022   ?  3:27 PM 11/01/2021  ?  1:19 PM 04/29/2021  ?  3:30 PM 02/01/2021  ?  1:33 PM 09/28/2020  ?  4:05 PM  ?Fall Risk   ?Falls in the past year? 0 0 1 0 0  ?Number falls in past yr: 0 0 0 0 0  ?Injury with

## 2022-05-25 ENCOUNTER — Other Ambulatory Visit: Payer: Self-pay

## 2022-05-25 MED ORDER — FLUTICASONE PROPIONATE 50 MCG/ACT NA SUSP: NASAL | 3 refills | Status: AC

## 2022-06-02 ENCOUNTER — Ambulatory Visit (HOSPITAL_COMMUNITY)
Admission: RE | Admit: 2022-06-02 | Discharge: 2022-06-02 | Disposition: A | Discharge status: Home / Self Care | Payer: Medicare Other | Source: Ambulatory Visit | Attending: Nurse Practitioner | Admitting: Nurse Practitioner

## 2022-06-02 ENCOUNTER — Encounter: Payer: Self-pay | Admitting: Nurse Practitioner

## 2022-06-02 ENCOUNTER — Ambulatory Visit: Payer: Medicare Other | Admitting: Nurse Practitioner

## 2022-06-02 ENCOUNTER — Ambulatory Visit (INDEPENDENT_AMBULATORY_CARE_PROVIDER_SITE_OTHER): Payer: Medicare Other | Admitting: Nurse Practitioner

## 2022-06-02 VITALS — BP 129/76 | HR 67 | Temp 97.2°F | Ht 64.0 in | Wt 146.1 lb

## 2022-06-02 DIAGNOSIS — G8929 Other chronic pain: Secondary | ICD-10-CM | POA: Diagnosis not present

## 2022-06-02 DIAGNOSIS — M25511 Pain in right shoulder: Secondary | ICD-10-CM

## 2022-06-02 DIAGNOSIS — I10 Essential (primary) hypertension: Secondary | ICD-10-CM

## 2022-06-02 DIAGNOSIS — R051 Acute cough: Secondary | ICD-10-CM | POA: Diagnosis not present

## 2022-06-02 MED ORDER — PREDNISONE 20 MG PO TABS: 20.0000 mg | ORAL_TABLET | Freq: Every day | ORAL | 0 refills | Status: AC

## 2022-06-02 MED ORDER — AZITHROMYCIN 250 MG PO TABS
ORAL_TABLET | ORAL | 0 refills | Status: AC
Start: 2022-06-02 — End: 2022-06-07

## 2022-06-02 NOTE — Assessment & Plan Note (Signed)
-   azithromycin (ZITHROMAX) 250 MG tablet; Take 2 tablets on day 1, then 1 tablet daily on days 2 through 5  Dispense: 6 tablet; Refill: 0 - predniSONE (DELTASONE) 20 MG tablet; Take 1 tablet (20 mg total) by mouth daily with breakfast for 5 days.  Dispense: 5 tablet; Refill: 0  2. Chronic right shoulder pain  - predniSONE (DELTASONE) 20 MG tablet; Take 1 tablet (20 mg total) by mouth daily with breakfast for 5 days.  Dispense: 5 tablet; Refill: 0 - DG Shoulder Right   Follow up:  Follow up in 3 months or sooner if needed

## 2022-06-02 NOTE — Patient Instructions (Addendum)
1. Acute cough  - azithromycin (ZITHROMAX) 250 MG tablet; Take 2 tablets on day 1, then 1 tablet daily on days 2 through 5  Dispense: 6 tablet; Refill: 0 - predniSONE (DELTASONE) 20 MG tablet; Take 1 tablet (20 mg total) by mouth daily with breakfast for 5 days.  Dispense: 5 tablet; Refill: 0  2. Chronic right shoulder pain  - predniSONE (DELTASONE) 20 MG tablet; Take 1 tablet (20 mg total) by mouth daily with breakfast for 5 days.  Dispense: 5 tablet; Refill: 0 - DG Shoulder Right   Follow up:  Follow up in 3 months or sooner if needed

## 2022-06-02 NOTE — Progress Notes (Signed)
@Patient  ID: , female    DOB: 1951/05/19, 71 y.o.   MRN: [id number]  Chief Complaint  Patient presents with   Follow-up    Pt is here is here for 4 month follow up. Pt states she is coughing up white mucus for the past 2 month's also rt side pain. Pt is requesting refill on all medications    Referring provider: [id number], NP   HPI  [facility] presents for follow up. She  has a past medical history of Asthma, CHF (congestive heart failure) (HCC), Diabetes mellitus without complication (HCC), History of 2019 novel coronavirus disease (COVID-19) (12/12/2019), Hyperlipidemia, Hyperlipidemia (01/2021), Hypertension, Hypokalemia (12/2019), Hypokalemia (01/2021), Myocardial infarction [facility]), Productive cough (01/2021), Seizures (HCC), Shortness of breath, Stroke (HCC), Vitamin D deficiency (01/2020), and Wheezes.   Patient presents today for follow-up visit.  She states that overall she has been stable.  She has been having a chronic cough for the past month.  She states that she has been having white mucus with this.  She also complains today of right shoulder pain which has been an issue for quite some time now.  There is no imaging in the chart.  We will order shoulder x-ray today.  We will order prednisone.  If patient is also slow patient may need referral to Ortho. Denies f/c/s, n/v/d, hemoptysis, PND, chest pain or edema.     Allergies  Allergen Reactions   Aspirin Nausea And Vomiting    Immunization History  Administered Date(s) Administered   Fluad Quad(high Dose 65+) 09/25/2020   Influenza Split 01/24/2011   Influenza Whole 10/12/2017, 10/04/2018   Influenza,inj,Quad PF,6+ Mos 12/27/2016, 02/02/2022   Influenza-Unspecified 01/24/2011, 10/31/2011, 09/23/2013, 12/22/2014   Pneumococcal Polysaccharide-23 01/24/2011    Past Medical History:  Diagnosis Date   Asthma    CHF (congestive heart failure) (HCC)    Diabetes mellitus without  complication (HCC)    History of 2019 novel coronavirus disease (COVID-19) 12/12/2019   Hyperlipidemia    Hyperlipidemia 01/2021   Hypertension    Hypokalemia 12/2019   Hypokalemia 01/2021   Myocardial infarction (HCC)    Productive cough 01/2021   Seizures (HCC)    Shortness of breath    Stroke (HCC)    Vitamin D deficiency 01/2020   Wheezes     Tobacco History: Social History   Tobacco Use  Smoking Status Former  Smokeless Tobacco Never   Counseling given: Not Answered   Outpatient Encounter Medications as of 06/02/2022  Medication Sig   acetaminophen (TYLENOL) 500 MG tablet Take 2 tablets (1,000 mg total) by mouth 2 (two) times daily as needed.   Acetaminophen-Codeine (TYLENOL/CODEINE #3) 300-30 MG tablet Take 1 tablet by mouth every 4 (four) hours as needed for pain.   albuterol (PROVENTIL) (2.5 MG/3ML) 0.083% nebulizer solution Take 3 mLs (2.5 mg total) by nebulization every 6 (six) hours as needed for wheezing or shortness of breath.   albuterol (VENTOLIN HFA) 108 (90 Base) MCG/ACT inhaler Inhale 2 puffs into the lungs every 4 (four) hours as needed for wheezing or shortness of breath.   atorvastatin (LIPITOR) 10 MG tablet Take 1 tablet (10 mg total) by mouth daily.   azithromycin (ZITHROMAX) 250 MG tablet Take 2 tablets on day 1, then 1 tablet daily on days 2 through 5   benzonatate (TESSALON) 100 MG capsule Take 1 capsule (100 mg total) by mouth 2 (two) times daily as needed for cough.   feeding supplement, ENSURE ENLIVE, (ENSURE  ENLIVE) LIQD Take 237 mLs by mouth 2 (two) times daily between meals.   fluticasone (FLONASE) 50 MCG/ACT nasal spray SHAKE LIQUID AND USE 2 SPRAYS IN EACH NOSTRIL DAILY   Fluticasone-Salmeterol (ADVAIR) 100-50 MCG/DOSE AEPB Inhale 1 puff into the lungs 2 (two) times daily.   losartan-hydrochlorothiazide (HYZAAR) 50-12.5 MG tablet Take 1 tablet by mouth daily.   lubiprostone (AMITIZA) 24 MCG capsule Take 1 capsule (24 mcg total) by mouth 2 (two)  times daily with a meal.   meloxicam (MOBIC) 7.5 MG tablet Take 1 tablet (7.5 mg total) by mouth daily.   montelukast (SINGULAIR) 10 MG tablet Take 1 tablet (10 mg total) by mouth at bedtime.   Nebulizer MISC 1 each by Does not apply route every 6 (six) hours as needed.   ofloxacin (OCUFLOX) 0.3 % ophthalmic solution    ondansetron (ZOFRAN-ODT) 4 MG disintegrating tablet Take 1 tablet (4 mg total) by mouth every 8 (eight) hours as needed for nausea or vomiting.   potassium chloride SA (KLOR-CON M) 20 MEQ tablet Take 1 tablet (20 mEq total) by mouth daily.   predniSONE (DELTASONE) 20 MG tablet Take 1 tablet (20 mg total) by mouth daily with breakfast for 5 days.   umeclidinium-vilanterol (ANORO ELLIPTA) 62.5-25 MCG/INH AEPB Inhale into the lungs.   No facility-administered encounter medications on file as of 06/02/2022.     Review of Systems  Review of Systems  Constitutional: Negative.   HENT: Negative.    Respiratory:  Positive for cough.   Cardiovascular: Negative.   Gastrointestinal: Negative.   Musculoskeletal:  Positive for arthralgias and myalgias.  Allergic/Immunologic: Negative.   Neurological: Negative.   Psychiatric/Behavioral: Negative.         Physical Exam  BP 129/76 (BP Location: Right Arm, Patient Position: Sitting, Cuff Size: Normal)   Pulse 67   Temp (!) 97.2 F (36.2 C)   Ht 5\' 4"  (1.626 m)   Wt 146 lb 2 oz (66.3 kg)   SpO2 100%   BMI 25.08 kg/m   Wt Readings from Last 5 Encounters:  06/02/22 146 lb 2 oz (66.3 kg)  02/02/22 155 lb 0.6 oz (70.3 kg)  11/01/21 159 lb (72.1 kg)  04/29/21 163 lb (73.9 kg)  02/01/21 163 lb (73.9 kg)     Physical Exam Vitals and nursing note reviewed.  Constitutional:      General: She is not in acute distress.    Appearance: She is well-developed.  Cardiovascular:     Rate and Rhythm: Normal rate and regular rhythm.  Pulmonary:     Effort: Pulmonary effort is normal.     Breath sounds: Normal breath sounds.   Neurological:     Mental Status: She is alert and oriented to person, place, and time.      Lab Results:  CBC    Component Value Date/Time   WBC 5.0 02/02/2022 1602   WBC 4.6 11/07/2021 1249   RBC 4.00 02/02/2022 1602   RBC 4.51 11/07/2021 1249   HGB 11.1 02/02/2022 1602   HCT 34.0 02/02/2022 1602   PLT 229 02/02/2022 1602   MCV 85 02/02/2022 1602   MCH 27.8 02/02/2022 1602   MCH 27.9 11/07/2021 1249   MCHC 32.6 02/02/2022 1602   MCHC 31.6 11/07/2021 1249   RDW 13.1 02/02/2022 1602   LYMPHSABS 1.9 02/02/2022 1602   MONOABS 0.6 11/07/2021 1249   EOSABS 0.1 02/02/2022 1602   BASOSABS 0.0 02/02/2022 1602    BMET    Component Value Date/Time  NA 140 02/02/2022 1602   K 3.7 02/02/2022 1602   CL 105 02/02/2022 1602   CO2 24 11/07/2021 1249   GLUCOSE 91 02/02/2022 1602   GLUCOSE 99 11/07/2021 1249   BUN 16 02/02/2022 1602   CREATININE 1.10 (H) 02/02/2022 1602   CALCIUM 12.3 (H) 02/02/2022 1602   GFRNONAA 42 (L) 11/07/2021 1249   GFRAA 64 02/01/2021 1427    BNP No results found for: "BNP"  ProBNP No results found for: "PROBNP"  Imaging: No results found.   Assessment & Plan:   Acute cough - azithromycin (ZITHROMAX) 250 MG tablet; Take 2 tablets on day 1, then 1 tablet daily on days 2 through 5  Dispense: 6 tablet; Refill: 0 - predniSONE (DELTASONE) 20 MG tablet; Take 1 tablet (20 mg total) by mouth daily with breakfast for 5 days.  Dispense: 5 tablet; Refill: 0  2. Chronic right shoulder pain  - predniSONE (DELTASONE) 20 MG tablet; Take 1 tablet (20 mg total) by mouth daily with breakfast for 5 days.  Dispense: 5 tablet; Refill: 0 - DG Shoulder Right   Follow up:  Follow up in 3 months or sooner if needed     Fenton Foy, NP 06/02/2022

## 2022-06-03 LAB — CBC
Hematocrit: 36.2 % (ref 34.0–46.6)
Hemoglobin: 12 g/dL (ref 11.1–15.9)
MCH: 28.2 pg (ref 26.6–33.0)
MCHC: 33.1 g/dL (ref 31.5–35.7)
MCV: 85 fL (ref 79–97)
Platelets: 216 10*3/uL (ref 150–450)
RBC: 4.25 x10E6/uL (ref 3.77–5.28)
RDW: 12.9 % (ref 11.7–15.4)
WBC: 4.6 10*3/uL (ref 3.4–10.8)

## 2022-06-03 LAB — COMPREHENSIVE METABOLIC PANEL
ALT: 9 IU/L (ref 0–32)
AST: 10 IU/L (ref 0–40)
Albumin/Globulin Ratio: 1.4 (ref 1.2–2.2)
Albumin: 4 g/dL (ref 3.8–4.8)
Alkaline Phosphatase: 84 IU/L (ref 44–121)
BUN/Creatinine Ratio: 11 — ABNORMAL LOW (ref 12–28)
BUN: 12 mg/dL (ref 8–27)
Bilirubin Total: 0.3 mg/dL (ref 0.0–1.2)
CO2: 26 mmol/L (ref 20–29)
Calcium: 12.4 mg/dL — ABNORMAL HIGH (ref 8.7–10.3)
Chloride: 102 mmol/L (ref 96–106)
Creatinine, Ser: 1.09 mg/dL — ABNORMAL HIGH (ref 0.57–1.00)
Globulin, Total: 2.8 g/dL (ref 1.5–4.5)
Glucose: 96 mg/dL (ref 70–99)
Potassium: 3.3 mmol/L — ABNORMAL LOW (ref 3.5–5.2)
Sodium: 141 mmol/L (ref 134–144)
Total Protein: 6.8 g/dL (ref 6.0–8.5)
eGFR: 55 mL/min/{1.73_m2} — ABNORMAL LOW (ref >59)

## 2022-06-06 ENCOUNTER — Other Ambulatory Visit: Payer: Self-pay

## 2022-06-06 ENCOUNTER — Encounter: Payer: Self-pay | Admitting: Nurse Practitioner

## 2022-06-06 DIAGNOSIS — I10 Essential (primary) hypertension: Secondary | ICD-10-CM

## 2022-06-06 DIAGNOSIS — E876 Hypokalemia: Secondary | ICD-10-CM

## 2022-06-06 DIAGNOSIS — E785 Hyperlipidemia, unspecified: Secondary | ICD-10-CM

## 2022-06-06 MED ORDER — POTASSIUM CHLORIDE CRYS ER 20 MEQ PO TBCR: 20.0000 meq | EXTENDED_RELEASE_TABLET | Freq: Every day | ORAL | 2 refills | Status: DC

## 2022-06-06 MED ORDER — LOSARTAN POTASSIUM-HCTZ 50-12.5 MG PO TABS: 1.0000 | ORAL_TABLET | Freq: Every day | ORAL | 1 refills | Status: DC

## 2022-06-06 MED ORDER — ATORVASTATIN CALCIUM 10 MG PO TABS: 10.0000 mg | ORAL_TABLET | Freq: Every day | ORAL | 1 refills | Status: DC

## 2022-07-18 ENCOUNTER — Telehealth: Payer: Self-pay

## 2022-07-18 MED ORDER — LUBIPROSTONE 24 MCG PO CAPS: 24.0000 ug | ORAL_CAPSULE | Freq: Two times a day (BID) | ORAL | 11 refills | Status: AC

## 2022-07-18 NOTE — Telephone Encounter (Signed)
No additional notes needed  

## 2022-07-19 ENCOUNTER — Telehealth: Payer: Self-pay

## 2022-07-20 ENCOUNTER — Other Ambulatory Visit: Payer: Self-pay | Admitting: Nurse Practitioner

## 2022-07-20 ENCOUNTER — Telehealth: Payer: Self-pay

## 2022-07-20 MED ORDER — FLUTICASONE-SALMETEROL 100-50 MCG/ACT IN AEPB: 1.0000 | INHALATION_SPRAY | Freq: Two times a day (BID) | RESPIRATORY_TRACT | 2 refills | Status: AC

## 2022-07-20 NOTE — Telephone Encounter (Signed)
This would have most likely been prescribed by GI  - please check notes - if so they would need to look at this. Thanks.

## 2022-08-03 NOTE — Telephone Encounter (Signed)
No additional notes needed  

## 2022-08-30 ENCOUNTER — Telehealth: Payer: Medicare Other | Admitting: Nurse Practitioner

## 2022-08-30 ENCOUNTER — Telehealth: Payer: Self-pay | Admitting: Nurse Practitioner

## 2022-08-30 NOTE — Telephone Encounter (Signed)
Attempted to call patient x 3 attempts for virtual visit. Left message for patient to call back to reschedule if needed.

## 2022-09-02 ENCOUNTER — Ambulatory Visit: Payer: Medicare Other | Admitting: Nurse Practitioner

## 2022-09-07 ENCOUNTER — Other Ambulatory Visit: Payer: Self-pay | Admitting: Nurse Practitioner

## 2022-09-07 DIAGNOSIS — R059 Cough, unspecified: Secondary | ICD-10-CM

## 2022-11-03 IMAGING — DX DG CHEST 1V PORT
1 series · 1 of 1 positions shown · non-contrast
Comparison: 12/19/2019 and older studies.

CLINICAL DATA: "Pt reports nausea, sweats, productive cough with
white phlegm, and body aches x 2 days. Hypotensive at triage."

EXAM:
PORTABLE CHEST 1 VIEW

[chest ap]
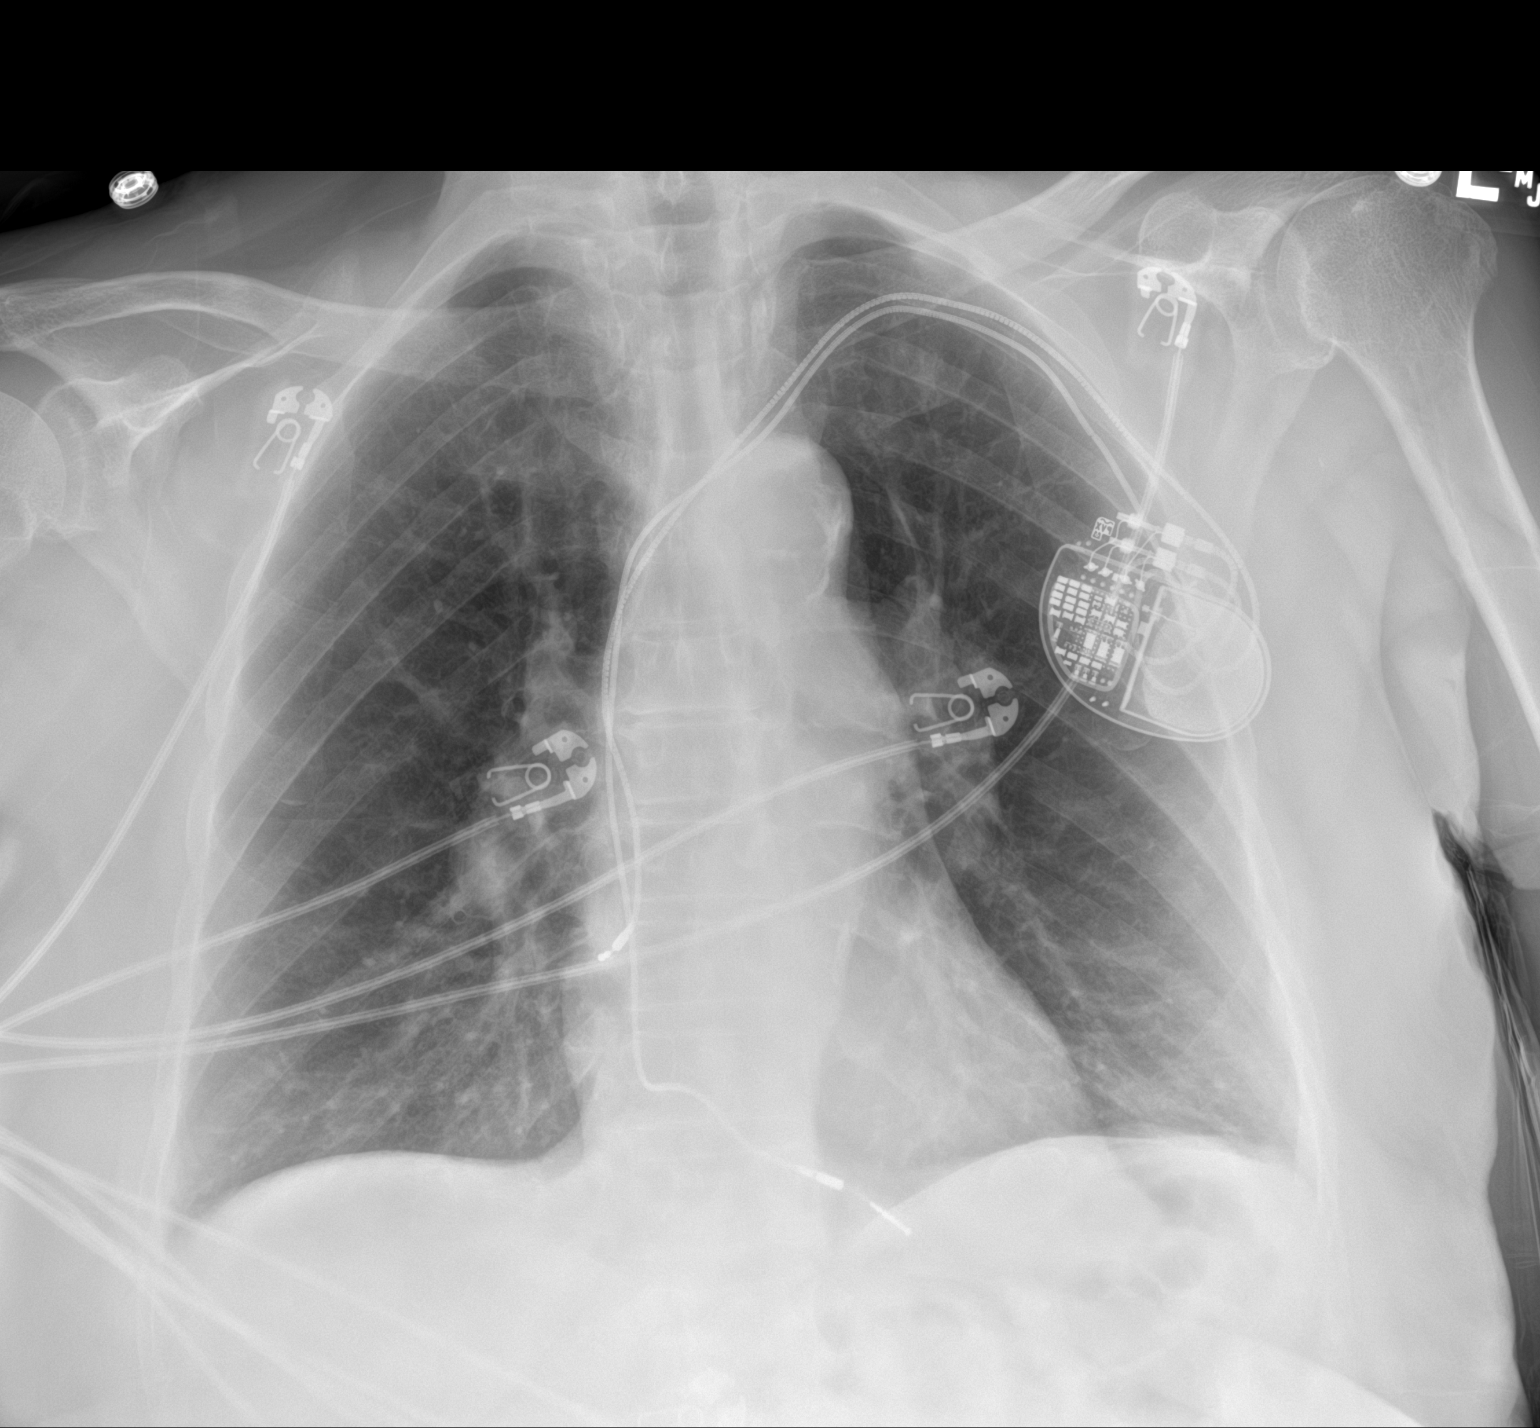

[1 of 1 positions shown; findings below may reference images not displayed]

FINDINGS: Cardiac silhouette is normal in size. Stable left anterior chest
wall dual lead pacemaker. No mediastinal or hilar masses.

Lungs are hyperexpanded, but clear.

No pleural effusion or pneumothorax.

Skeletal structures are grossly intact.
IMPRESSION: No active disease.

## 2023-01-12 ENCOUNTER — Other Ambulatory Visit: Payer: Self-pay

## 2023-01-12 DIAGNOSIS — E876 Hypokalemia: Secondary | ICD-10-CM

## 2023-01-12 DIAGNOSIS — I10 Essential (primary) hypertension: Secondary | ICD-10-CM

## 2023-01-12 MED ORDER — LOSARTAN POTASSIUM-HCTZ 50-12.5 MG PO TABS: 1.0000 | ORAL_TABLET | Freq: Every day | ORAL | 0 refills | Status: AC

## 2023-01-12 MED ORDER — POTASSIUM CHLORIDE CRYS ER 20 MEQ PO TBCR: 20.0000 meq | EXTENDED_RELEASE_TABLET | Freq: Every day | ORAL | 0 refills | Status: AC

## 2023-03-30 ENCOUNTER — Other Ambulatory Visit: Payer: Self-pay

## 2023-03-30 DIAGNOSIS — E785 Hyperlipidemia, unspecified: Secondary | ICD-10-CM

## 2023-03-30 MED ORDER — ATORVASTATIN CALCIUM 10 MG PO TABS: 10.0000 mg | ORAL_TABLET | Freq: Every day | ORAL | 0 refills | Status: AC

## 2023-04-11 ENCOUNTER — Telehealth: Payer: Self-pay

## 2023-04-11 NOTE — Telephone Encounter (Signed)
Pt was reached and she advise she Is no longer a patient of this office she has move out of [place]. KH

## 2023-05-29 IMAGING — DX DG SHOULDER 2+V*R*
3 series · 3 of 3 positions shown · non-contrast
Comparison: None Available.

CLINICAL DATA: Chronic shoulder pain

EXAM:
RIGHT SHOULDER - 2+ VIEW

[shoulder grashey]
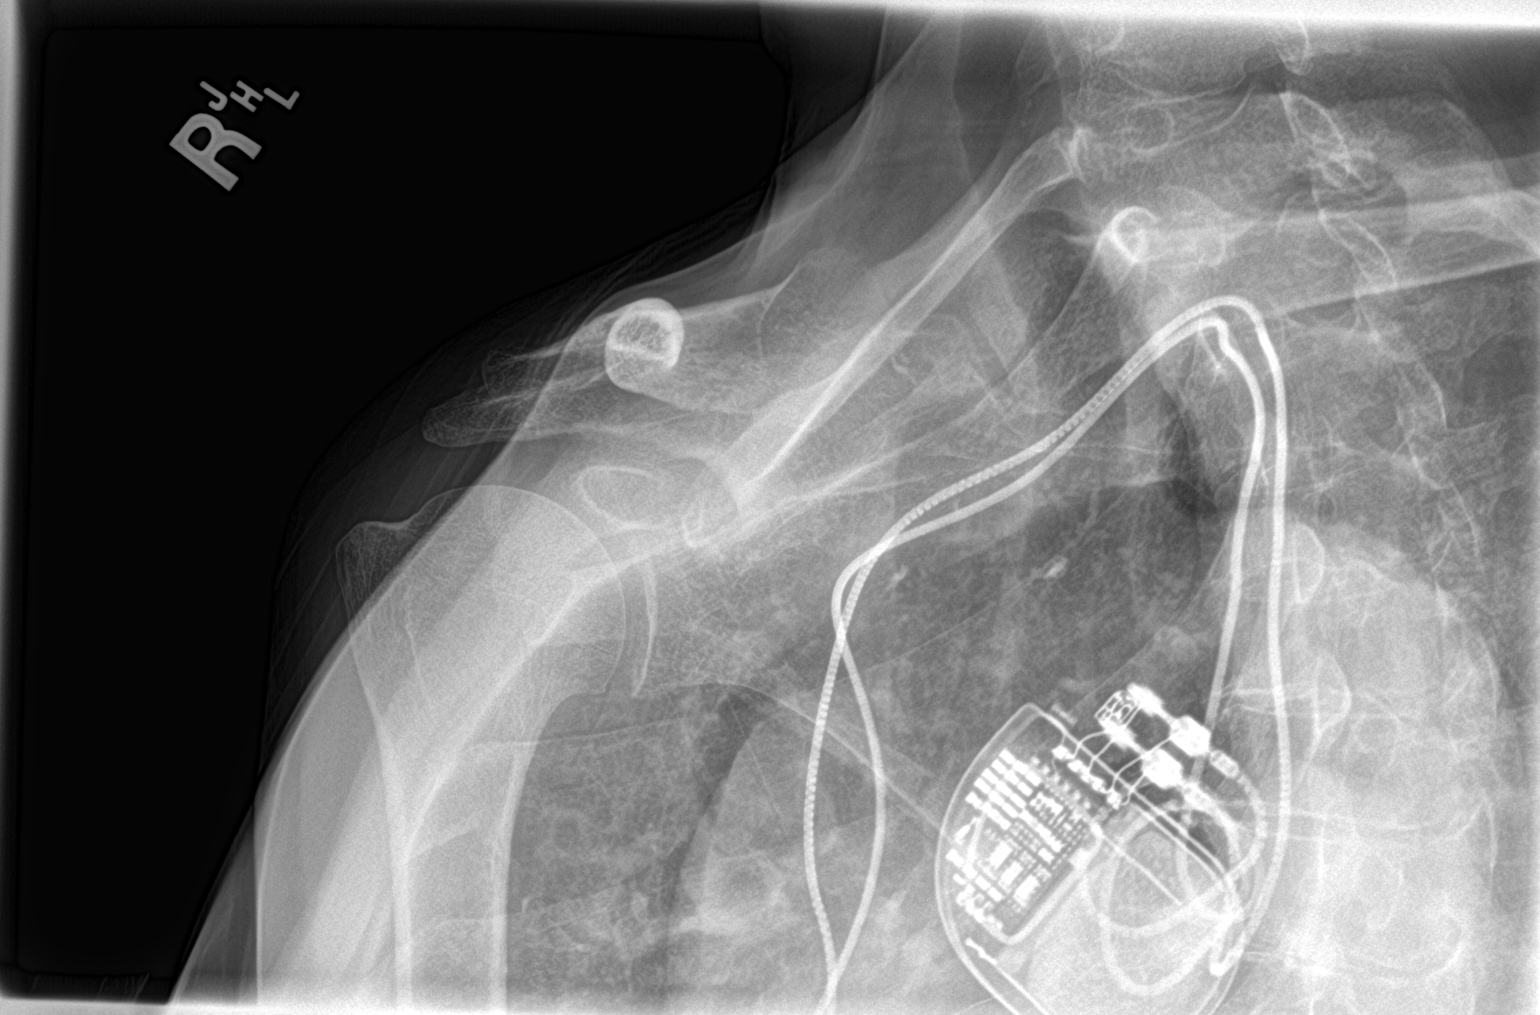

[shoulder y view]
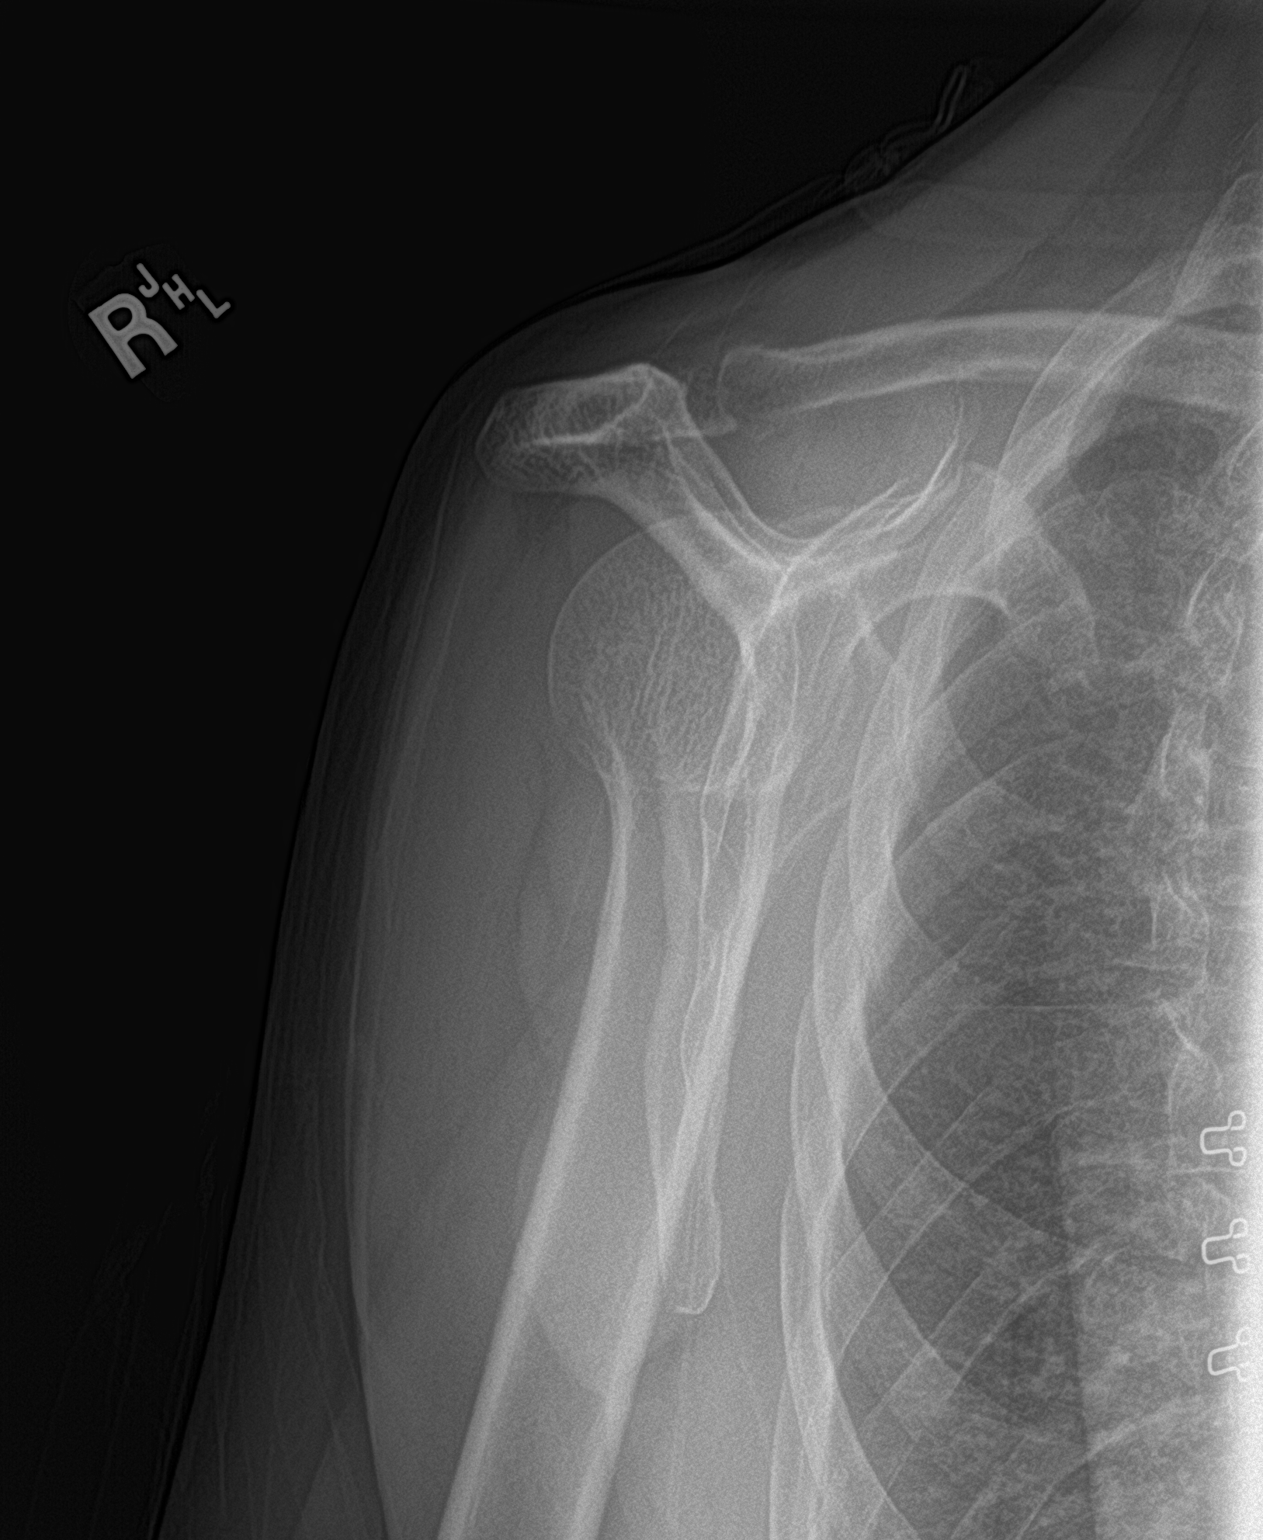

[shoulder axillary]
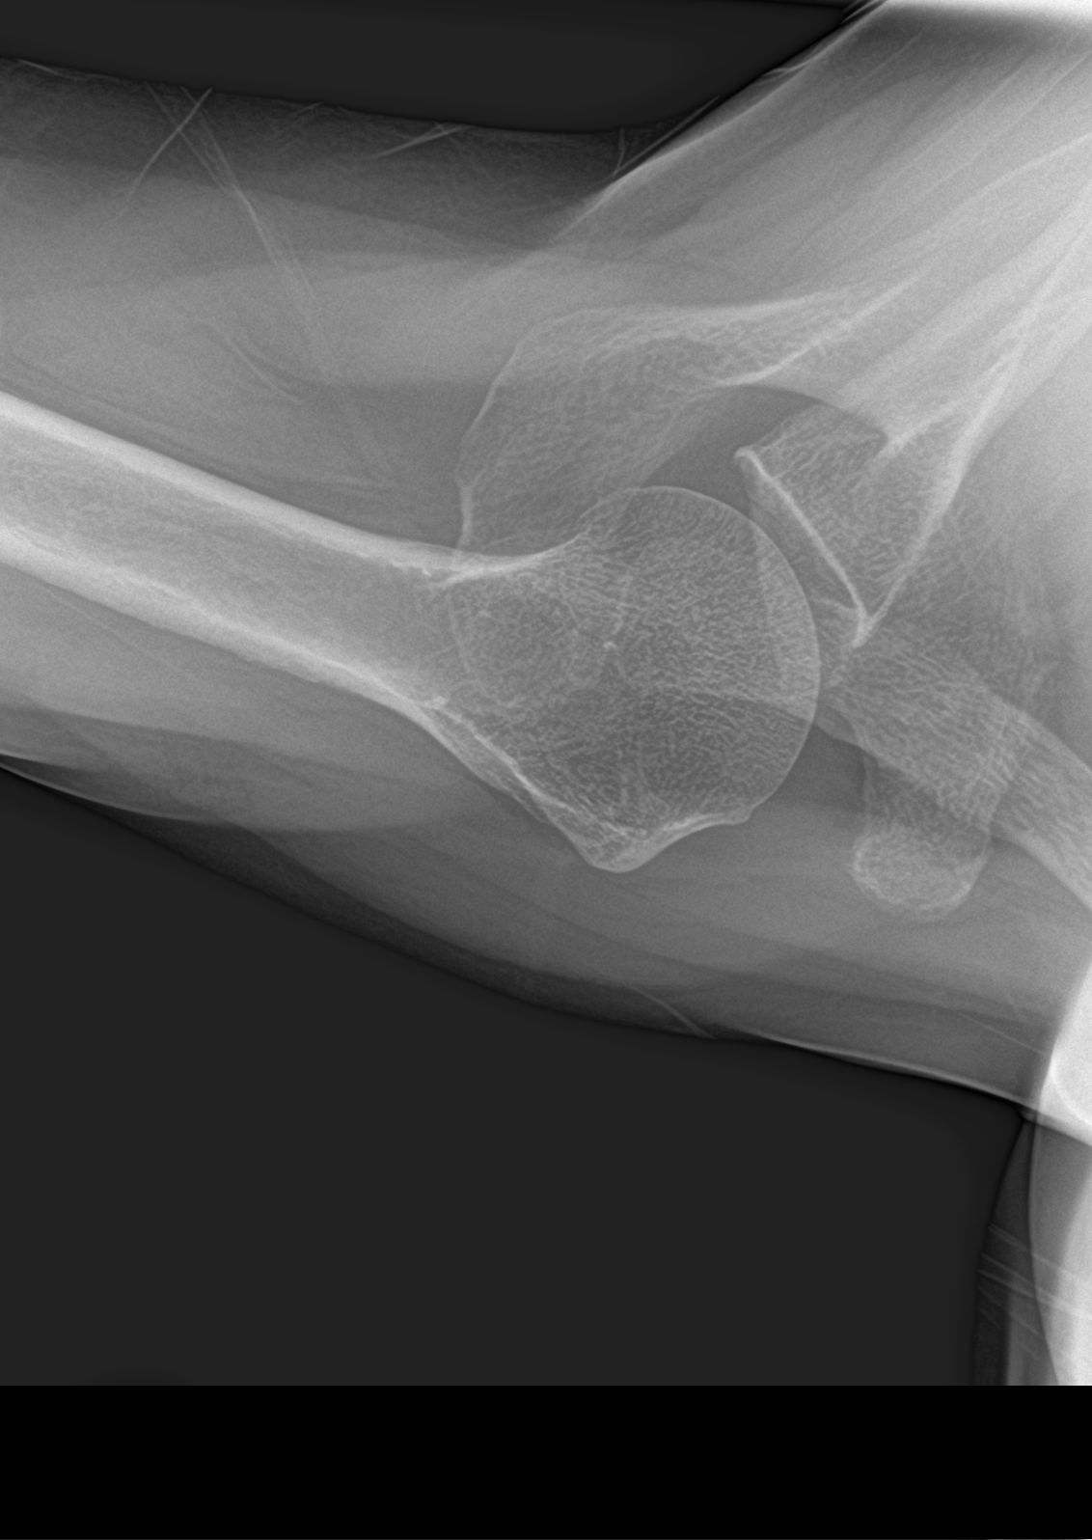

[3 of 3 positions shown; findings below may reference images not displayed]

FINDINGS: No fracture or malalignment.  Right lung apex is clear.
IMPRESSION: Negative.

## 2024-06-28 ENCOUNTER — Telehealth: Payer: Self-pay

## 2024-06-28 NOTE — Telephone Encounter (Signed)
 Patient was identified as falling into the [place] Measure - Diabetes.   Patient was: Patient is not currently using our practice.
